# Patient Record
Sex: Female | Born: 1985 | Race: White | Hispanic: No | Smoking: Never smoker
Health system: Southern US, Community
[De-identification: ages and names within clinical notes are randomized; demographics above are authoritative.]

## PROBLEM LIST (undated history)

## (undated) ENCOUNTER — Inpatient Hospital Stay (HOSPITAL_COMMUNITY): Payer: Self-pay

## (undated) DIAGNOSIS — F32A Depression, unspecified: Secondary | ICD-10-CM

## (undated) DIAGNOSIS — E039 Hypothyroidism, unspecified: Secondary | ICD-10-CM

## (undated) DIAGNOSIS — I10 Essential (primary) hypertension: Secondary | ICD-10-CM

## (undated) DIAGNOSIS — E119 Type 2 diabetes mellitus without complications: Secondary | ICD-10-CM

## (undated) DIAGNOSIS — F329 Major depressive disorder, single episode, unspecified: Secondary | ICD-10-CM

## (undated) HISTORY — PX: WISDOM TOOTH EXTRACTION: SHX21

---

## 2008-01-30 ENCOUNTER — Inpatient Hospital Stay (HOSPITAL_COMMUNITY): Admission: AD | Admit: 2008-01-30 | Discharge: 2008-01-30 | Payer: Self-pay | Admitting: Obstetrics and Gynecology

## 2008-03-06 ENCOUNTER — Inpatient Hospital Stay (HOSPITAL_COMMUNITY): Admission: AD | Admit: 2008-03-06 | Discharge: 2008-03-07 | Payer: Self-pay | Admitting: Obstetrics and Gynecology

## 2008-03-30 ENCOUNTER — Inpatient Hospital Stay (HOSPITAL_COMMUNITY): Admission: AD | Admit: 2008-03-30 | Discharge: 2008-03-30 | Payer: Self-pay | Admitting: Obstetrics and Gynecology

## 2008-03-31 ENCOUNTER — Inpatient Hospital Stay (HOSPITAL_COMMUNITY): Admission: AD | Admit: 2008-03-31 | Discharge: 2008-03-31 | Payer: Self-pay | Admitting: Obstetrics and Gynecology

## 2008-04-07 ENCOUNTER — Inpatient Hospital Stay (HOSPITAL_COMMUNITY): Admission: AD | Admit: 2008-04-07 | Discharge: 2008-04-07 | Payer: Self-pay | Admitting: Obstetrics and Gynecology

## 2008-04-10 ENCOUNTER — Inpatient Hospital Stay (HOSPITAL_COMMUNITY): Admission: AD | Admit: 2008-04-10 | Discharge: 2008-04-15 | Payer: Self-pay | Admitting: Obstetrics and Gynecology

## 2008-04-13 ENCOUNTER — Encounter (INDEPENDENT_AMBULATORY_CARE_PROVIDER_SITE_OTHER): Payer: Self-pay | Admitting: Obstetrics and Gynecology

## 2008-04-27 DIAGNOSIS — F329 Major depressive disorder, single episode, unspecified: Secondary | ICD-10-CM

## 2008-04-27 DIAGNOSIS — E669 Obesity, unspecified: Secondary | ICD-10-CM | POA: Insufficient documentation

## 2008-04-27 DIAGNOSIS — F411 Generalized anxiety disorder: Secondary | ICD-10-CM

## 2008-04-27 DIAGNOSIS — J309 Allergic rhinitis, unspecified: Secondary | ICD-10-CM | POA: Insufficient documentation

## 2008-10-08 IMAGING — US US FETAL BPP W/O NONSTRESS
1 series · 14 of 16 positions shown · non-contrast
Comparison: none

OBSTETRICAL ULTRASOUND:
 This ultrasound exam was performed in the [HOSPITAL] Ultrasound Department.  The OB US report was generated in the AS system, and faxed to the ordering physician.  This report is also available in [REDACTED] PACS.

[Series 1: us fetal bpp w/o nonstress · non-contrast · 14 of 16 slices shown]
[im 1/16]
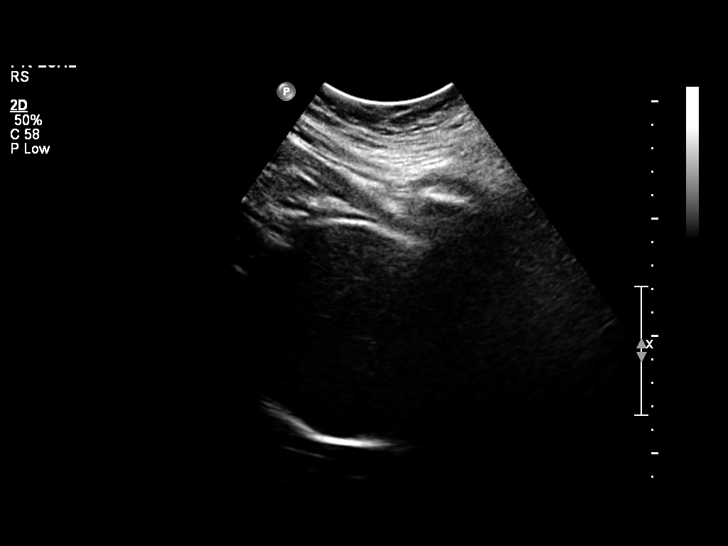
[im 2/16]
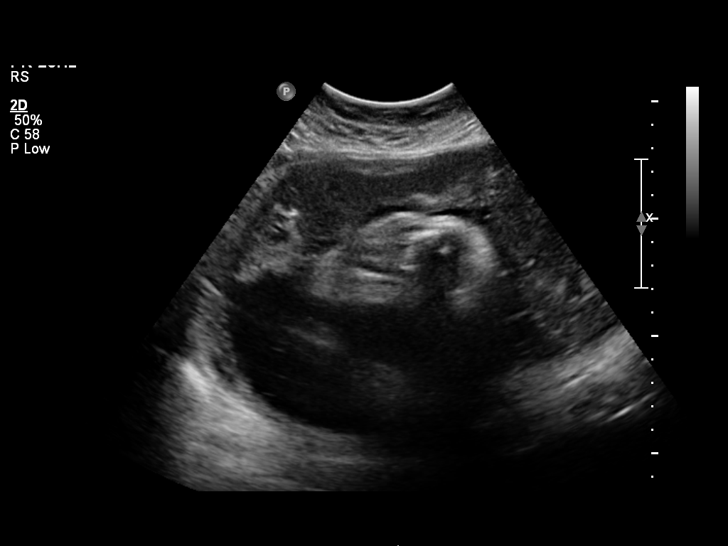
[im 3/16]
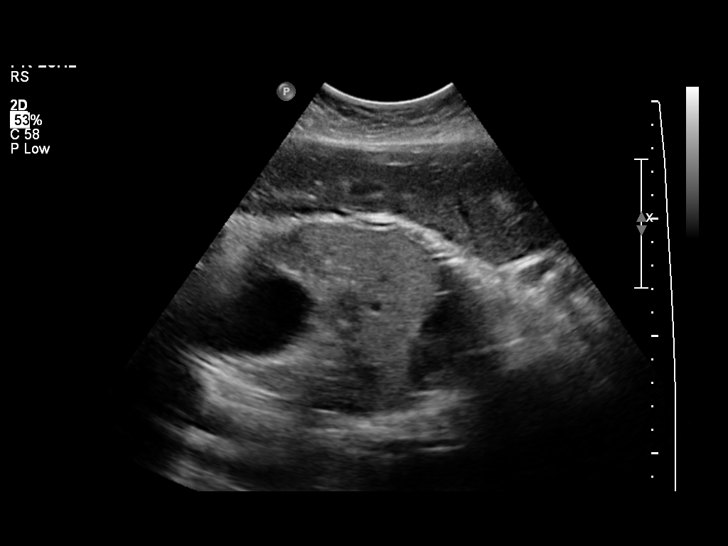
[im 5/16]
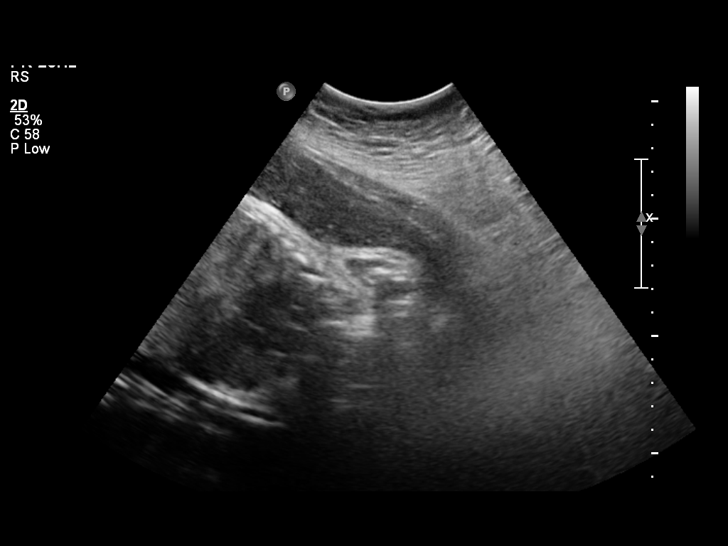
[im 6/16]
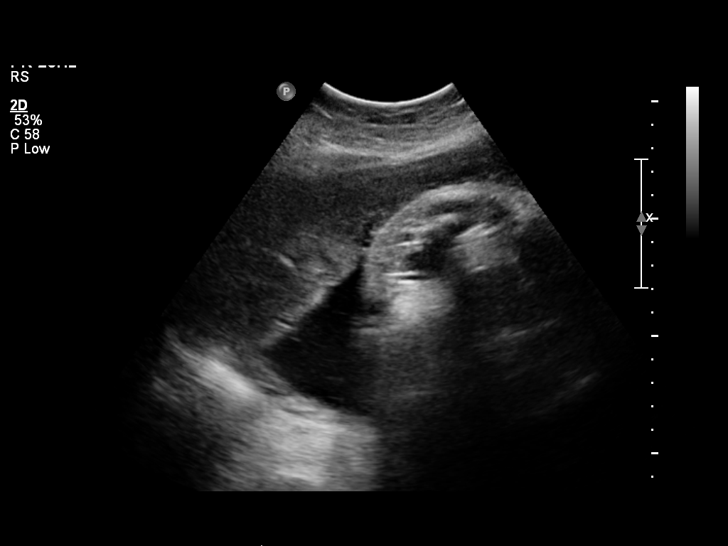
[im 7/16]
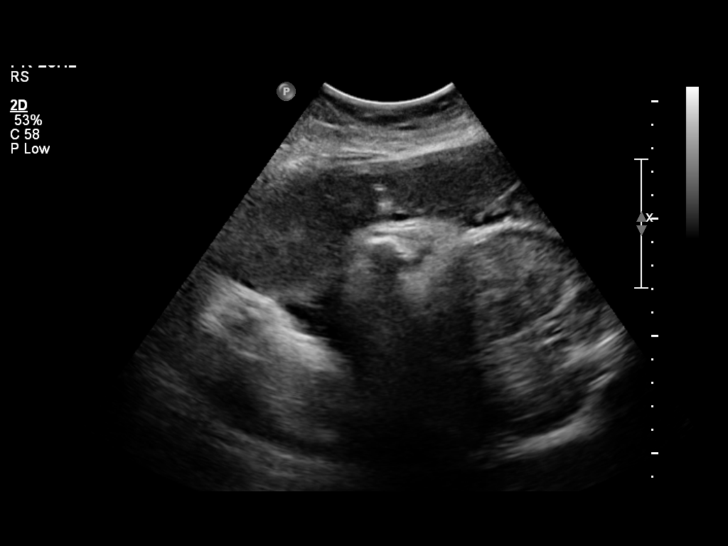
[im 8/16]
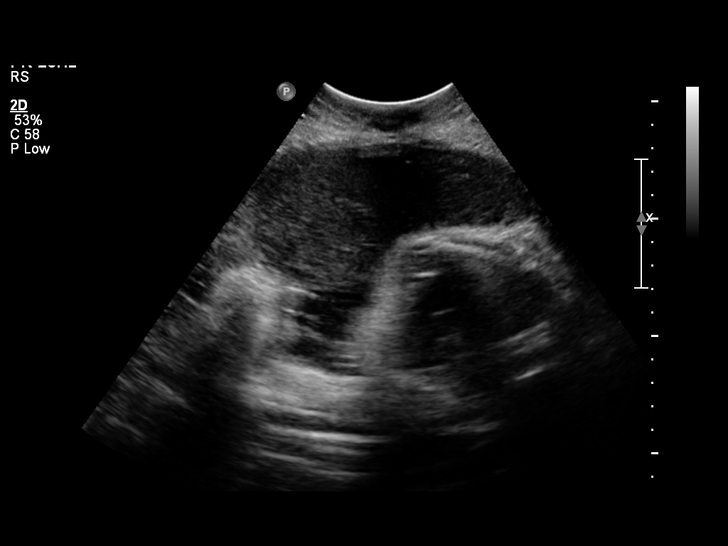
[im 9/16]
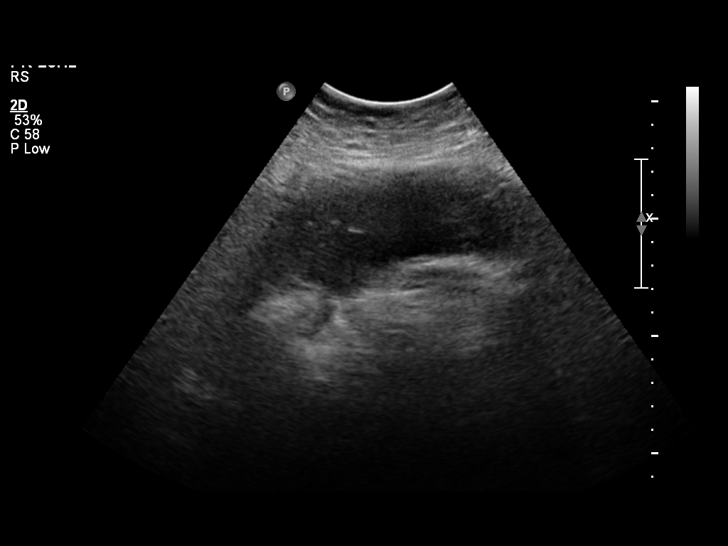
[im 10/16]
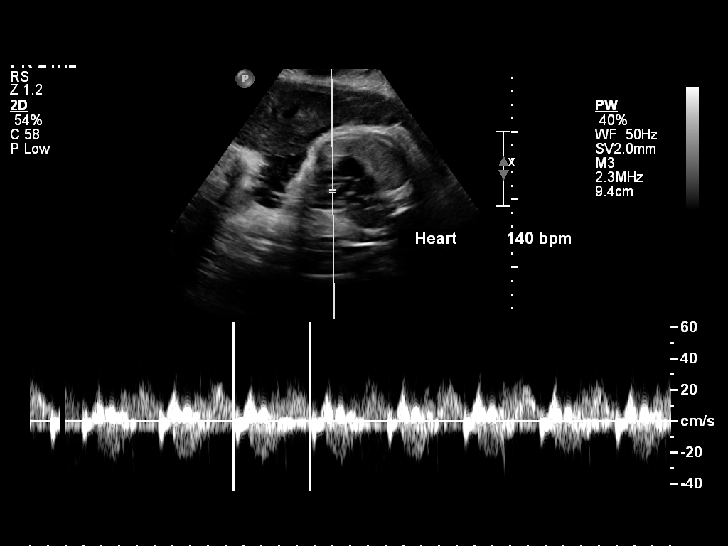
[im 11/16]
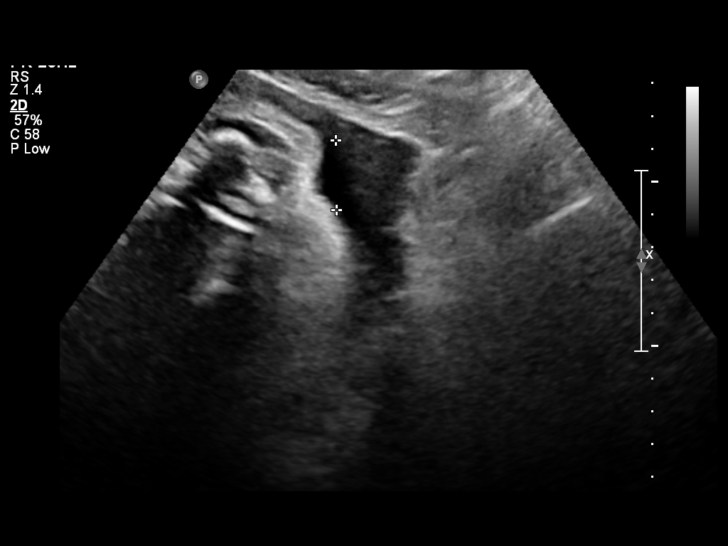
[im 13/16]
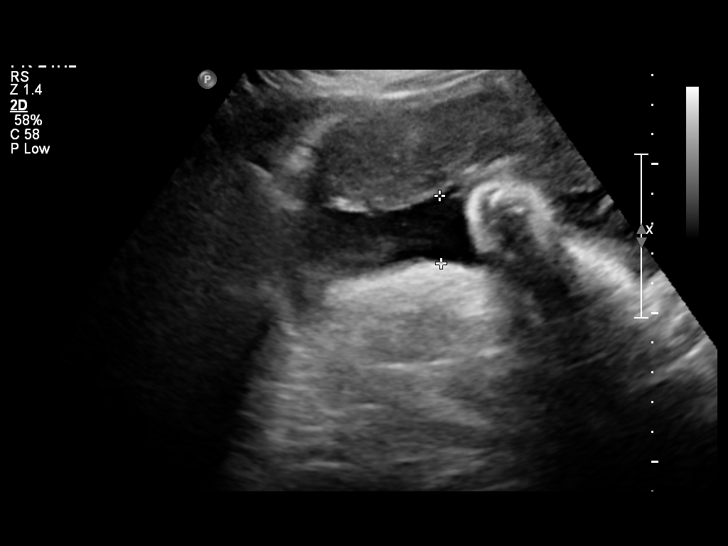
[im 14/16]
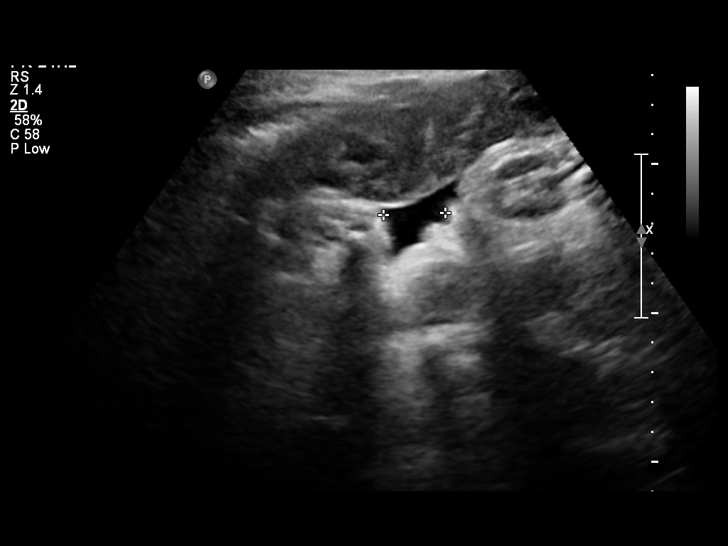
[im 15/16]
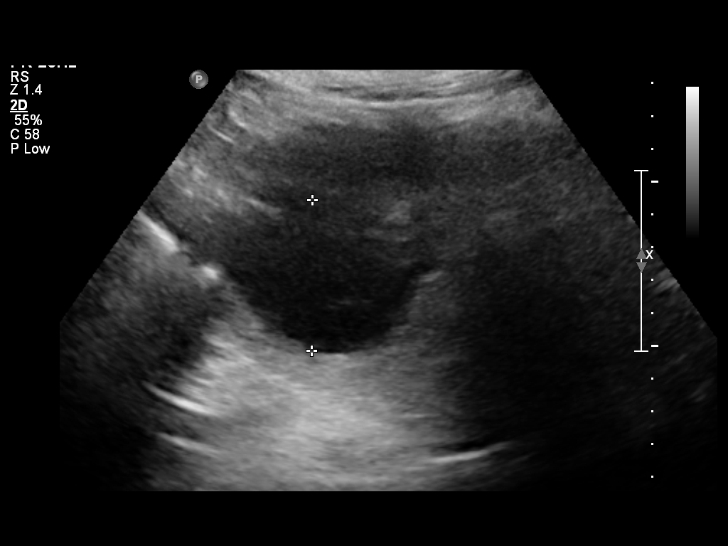
[im 16/16]
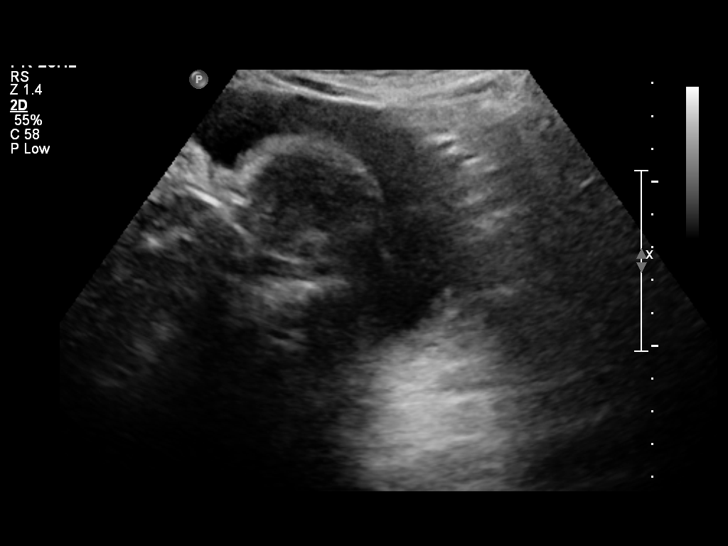

[14 of 16 positions shown; findings below may reference images not displayed]

IMPRESSION: See AS Obstetric US report.

## 2009-05-01 ENCOUNTER — Ambulatory Visit (HOSPITAL_COMMUNITY): Admission: RE | Admit: 2009-05-01 | Discharge: 2009-05-01 | Payer: Self-pay | Admitting: Obstetrics and Gynecology

## 2009-05-16 ENCOUNTER — Inpatient Hospital Stay (HOSPITAL_COMMUNITY): Admission: AD | Admit: 2009-05-16 | Discharge: 2009-05-16 | Payer: Self-pay | Admitting: Obstetrics and Gynecology

## 2009-06-12 ENCOUNTER — Inpatient Hospital Stay (HOSPITAL_COMMUNITY): Admission: AD | Admit: 2009-06-12 | Discharge: 2009-06-12 | Payer: Self-pay | Admitting: Obstetrics and Gynecology

## 2009-06-17 ENCOUNTER — Inpatient Hospital Stay (HOSPITAL_COMMUNITY): Admission: AD | Admit: 2009-06-17 | Discharge: 2009-06-20 | Payer: Self-pay | Admitting: Obstetrics and Gynecology

## 2009-08-26 ENCOUNTER — Ambulatory Visit (HOSPITAL_COMMUNITY): Admission: RE | Admit: 2009-08-26 | Discharge: 2009-08-26 | Payer: Self-pay | Admitting: Obstetrics and Gynecology

## 2010-10-19 LAB — CBC
MCV: 82.2 fL (ref 78.0–100.0)
Platelets: 272 10*3/uL (ref 150–400)
RBC: 4.76 MIL/uL (ref 3.87–5.11)
WBC: 9.8 10*3/uL (ref 4.0–10.5)

## 2010-10-19 LAB — PREGNANCY, URINE: Preg Test, Ur: NEGATIVE

## 2010-10-19 LAB — URINALYSIS, ROUTINE W REFLEX MICROSCOPIC
Nitrite: NEGATIVE
Specific Gravity, Urine: 1.015 (ref 1.005–1.030)
Urobilinogen, UA: 0.2 mg/dL (ref 0.0–1.0)
pH: 5 (ref 5.0–8.0)

## 2010-11-05 LAB — COMPREHENSIVE METABOLIC PANEL
ALT: 31 U/L (ref 0–35)
AST: 25 U/L (ref 0–37)
AST: 40 U/L — ABNORMAL HIGH (ref 0–37)
Albumin: 2.5 g/dL — ABNORMAL LOW (ref 3.5–5.2)
Alkaline Phosphatase: 129 U/L — ABNORMAL HIGH (ref 39–117)
CO2: 22 mEq/L (ref 19–32)
CO2: 23 mEq/L (ref 19–32)
Calcium: 8.8 mg/dL (ref 8.4–10.5)
Chloride: 105 mEq/L (ref 96–112)
Creatinine, Ser: 0.52 mg/dL (ref 0.4–1.2)
GFR calc Af Amer: >60 mL/min (ref >60)
GFR calc Af Amer: >60 mL/min (ref >60)
GFR calc non Af Amer: >60 mL/min (ref >60)
GFR calc non Af Amer: >60 mL/min (ref >60)
Sodium: 136 mEq/L (ref 135–145)
Total Bilirubin: 0.2 mg/dL — ABNORMAL LOW (ref 0.3–1.2)

## 2010-11-05 LAB — URINE MICROSCOPIC-ADD ON

## 2010-11-05 LAB — CBC
HCT: 27.7 % — ABNORMAL LOW (ref 36.0–46.0)
Hemoglobin: 9.3 g/dL — ABNORMAL LOW (ref 12.0–15.0)
Hemoglobin: 9.9 g/dL — ABNORMAL LOW (ref 12.0–15.0)
Hemoglobin: 10.6 g/dL — ABNORMAL LOW (ref 12.0–15.0)
MCHC: 33.2 g/dL (ref 30.0–36.0)
MCHC: 33.4 g/dL (ref 30.0–36.0)
MCHC: 33.7 g/dL (ref 30.0–36.0)
MCV: 81.2 fL (ref 78.0–100.0)
MCV: 80.2 fL (ref 78.0–100.0)
Platelets: 179 10*3/uL (ref 150–400)
RBC: 3.41 MIL/uL — ABNORMAL LOW (ref 3.87–5.11)
RBC: 3.71 MIL/uL — ABNORMAL LOW (ref 3.87–5.11)
RBC: 3.88 MIL/uL (ref 3.87–5.11)
RDW: 15.1 % (ref 11.5–15.5)
WBC: 14.9 10*3/uL — ABNORMAL HIGH (ref 4.0–10.5)
WBC: 15.3 10*3/uL — ABNORMAL HIGH (ref 4.0–10.5)
WBC: 11.8 10*3/uL — ABNORMAL HIGH (ref 4.0–10.5)

## 2010-11-05 LAB — URINALYSIS, ROUTINE W REFLEX MICROSCOPIC
Bilirubin Urine: NEGATIVE
Glucose, UA: NEGATIVE mg/dL
Hgb urine dipstick: NEGATIVE
Protein, ur: NEGATIVE mg/dL

## 2010-11-05 LAB — RPR: RPR Ser Ql: NONREACTIVE

## 2010-11-05 LAB — RH IMMUNE GLOB WKUP(>/=20WKS)(NOT WOMEN'S HOSP)

## 2010-11-06 LAB — URIC ACID: Uric Acid, Serum: 5.2 mg/dL (ref 2.4–7.0)

## 2010-11-06 LAB — COMPREHENSIVE METABOLIC PANEL
AST: 24 U/L (ref 0–37)
Albumin: 3 g/dL — ABNORMAL LOW (ref 3.5–5.2)
Alkaline Phosphatase: 93 U/L (ref 39–117)
BUN: 3 mg/dL — ABNORMAL LOW (ref 6–23)
Chloride: 104 mEq/L (ref 96–112)
Potassium: 3.4 mEq/L — ABNORMAL LOW (ref 3.5–5.1)
Total Bilirubin: 0.6 mg/dL (ref 0.3–1.2)

## 2010-11-06 LAB — CBC
HCT: 32.5 % — ABNORMAL LOW (ref 36.0–46.0)
Platelets: 198 10*3/uL (ref 150–400)
WBC: 11.3 10*3/uL — ABNORMAL HIGH (ref 4.0–10.5)

## 2010-11-07 LAB — RH IMMUNE GLOBULIN WORKUP (NOT WOMEN'S HOSP)
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
## 2011-04-30 LAB — RH IMMUNE GLOBULIN WORKUP (NOT WOMEN'S HOSP)
ABO/RH(D): A NEG
Antibody Screen: NEGATIVE

## 2011-05-01 LAB — URINALYSIS, ROUTINE W REFLEX MICROSCOPIC
Bilirubin Urine: NEGATIVE
Hgb urine dipstick: NEGATIVE
Ketones, ur: NEGATIVE
Protein, ur: NEGATIVE
Urobilinogen, UA: 0.2

## 2011-05-06 LAB — COMPREHENSIVE METABOLIC PANEL
AST: 34
Albumin: 2.3 — ABNORMAL LOW
BUN: 4 — ABNORMAL LOW
BUN: 8
BUN: 9
CO2: 23
CO2: 25
Calcium: 7.2 — ABNORMAL LOW
Calcium: 8.5
Calcium: 8.6
Chloride: 104
Creatinine, Ser: 0.48
Creatinine, Ser: 0.54
Creatinine, Ser: 0.64
GFR calc Af Amer: >60
GFR calc Af Amer: >60
GFR calc non Af Amer: >60
GFR calc non Af Amer: >60
GFR calc non Af Amer: >60
Glucose, Bld: 75
Sodium: 135
Total Bilirubin: 0.2 — ABNORMAL LOW
Total Protein: 5.9 — ABNORMAL LOW

## 2011-05-06 LAB — PROTEIN, URINE, 24 HOUR
Protein, 24H Urine: 1071 — ABNORMAL HIGH
Protein, Urine: 102

## 2011-05-06 LAB — URINALYSIS, ROUTINE W REFLEX MICROSCOPIC
Ketones, ur: NEGATIVE
Leukocytes, UA: NEGATIVE
Nitrite: NEGATIVE
Specific Gravity, Urine: <1.005 — ABNORMAL LOW
Urobilinogen, UA: 0.2
pH: 6

## 2011-05-06 LAB — CBC
HCT: 27.2 — ABNORMAL LOW
HCT: 33.3 — ABNORMAL LOW
Hemoglobin: 10.3 — ABNORMAL LOW
Hemoglobin: 10.9 — ABNORMAL LOW
Hemoglobin: 11.1 — ABNORMAL LOW
MCHC: 33.4
MCHC: 33.4
MCHC: 33.5
MCHC: 33.8
MCV: 85.8
MCV: 85.9
MCV: 86.3
MCV: 86.4
Platelets: 147 — ABNORMAL LOW
Platelets: 165
RBC: 3.16 — ABNORMAL LOW
RBC: 3.78 — ABNORMAL LOW
RDW: 14.1
RDW: 14.3
WBC: 16.9 — ABNORMAL HIGH
WBC: 19.1 — ABNORMAL HIGH
WBC: 23.2 — ABNORMAL HIGH

## 2011-05-06 LAB — CREATININE CLEARANCE, URINE, 24 HOUR
Collection Interval-CRCL: 24
Creatinine, Urine: 137.7
Urine Total Volume-CRCL: 1050

## 2011-05-06 LAB — URIC ACID
Uric Acid, Serum: 4.5
Uric Acid, Serum: 5.1
Uric Acid, Serum: 5.6

## 2011-05-06 LAB — RPR: RPR Ser Ql: NONREACTIVE

## 2011-05-06 LAB — MAGNESIUM: Magnesium: 5.5 — ABNORMAL HIGH

## 2011-05-06 LAB — URINE MICROSCOPIC-ADD ON

## 2011-05-06 LAB — RH IMMUNE GLOB WKUP(>/=20WKS)(NOT WOMEN'S HOSP)

## 2011-05-06 LAB — LACTATE DEHYDROGENASE: LDH: 224

## 2013-08-01 DIAGNOSIS — F4310 Post-traumatic stress disorder, unspecified: Secondary | ICD-10-CM | POA: Insufficient documentation

## 2014-06-01 LAB — OB RESULTS CONSOLE HEPATITIS B SURFACE ANTIGEN: HEP B S AG: NEGATIVE

## 2014-06-01 LAB — OB RESULTS CONSOLE GC/CHLAMYDIA
CHLAMYDIA, DNA PROBE: NEGATIVE
Gonorrhea: NEGATIVE

## 2014-06-01 LAB — OB RESULTS CONSOLE RUBELLA ANTIBODY, IGM: Rubella: IMMUNE

## 2014-06-01 LAB — OB RESULTS CONSOLE HIV ANTIBODY (ROUTINE TESTING): HIV: NONREACTIVE

## 2014-06-01 LAB — OB RESULTS CONSOLE RPR: RPR: NONREACTIVE

## 2014-08-03 ENCOUNTER — Encounter (HOSPITAL_COMMUNITY): Payer: Self-pay

## 2014-08-03 ENCOUNTER — Inpatient Hospital Stay (HOSPITAL_COMMUNITY)
Admission: AD | Admit: 2014-08-03 | Discharge: 2014-08-03 | Disposition: A | Discharge status: Home / Self Care | Payer: Medicaid Other | Source: Ambulatory Visit | Attending: Obstetrics & Gynecology | Admitting: Obstetrics & Gynecology

## 2014-08-03 DIAGNOSIS — R109 Unspecified abdominal pain: Secondary | ICD-10-CM | POA: Diagnosis present

## 2014-08-03 DIAGNOSIS — O9989 Other specified diseases and conditions complicating pregnancy, childbirth and the puerperium: Secondary | ICD-10-CM | POA: Insufficient documentation

## 2014-08-03 DIAGNOSIS — Z3A28 28 weeks gestation of pregnancy: Secondary | ICD-10-CM | POA: Insufficient documentation

## 2014-08-03 HISTORY — DX: Type 2 diabetes mellitus without complications: E11.9

## 2014-08-03 HISTORY — DX: Depression, unspecified: F32.A

## 2014-08-03 HISTORY — DX: Essential (primary) hypertension: I10

## 2014-08-03 HISTORY — DX: Major depressive disorder, single episode, unspecified: F32.9

## 2014-08-03 HISTORY — DX: Hypothyroidism, unspecified: E03.9

## 2014-08-03 LAB — WET PREP, GENITAL
Clue Cells Wet Prep HPF POC: NONE SEEN
TRICH WET PREP: NONE SEEN
Yeast Wet Prep HPF POC: NONE SEEN

## 2014-08-03 LAB — URINALYSIS, ROUTINE W REFLEX MICROSCOPIC
Glucose, UA: NEGATIVE mg/dL
Hgb urine dipstick: NEGATIVE
KETONES UR: NEGATIVE mg/dL
Leukocytes, UA: NEGATIVE
NITRITE: NEGATIVE
Protein, ur: NEGATIVE mg/dL
UROBILINOGEN UA: 0.2 mg/dL (ref 0.0–1.0)
pH: 5.5 (ref 5.0–8.0)

## 2014-08-03 LAB — GLUCOSE, CAPILLARY: GLUCOSE-CAPILLARY: 89 mg/dL (ref 70–99)

## 2014-08-03 LAB — FETAL FIBRONECTIN: FETAL FIBRONECTIN: NEGATIVE

## 2014-08-03 MED ORDER — PANTOPRAZOLE SODIUM 40 MG PO TBEC: 40.0000 mg | DELAYED_RELEASE_TABLET | Freq: Every day | ORAL | Status: DC

## 2014-08-03 NOTE — Discharge Instructions (Signed)
Third Trimester of Pregnancy The third trimester is from week 29 through week 42, months 7 through 9. The third trimester is a time when the fetus is growing rapidly. At the end of the ninth month, the fetus is about 20 inches in length and weighs 6-10 pounds.  BODY CHANGES Your body goes through many changes during pregnancy. The changes vary from woman to woman.   Your weight will continue to increase. You can expect to gain 25-35 pounds (11-16 kg) by the end of the pregnancy.  You may begin to get stretch marks on your hips, abdomen, and breasts.  You may urinate more often because the fetus is moving lower into your pelvis and pressing on your bladder.  You may develop or continue to have heartburn as a result of your pregnancy.  You may develop constipation because certain hormones are causing the muscles that push waste through your intestines to slow down.  You may develop hemorrhoids or swollen, bulging veins (varicose veins).  You may have pelvic pain because of the weight gain and pregnancy hormones relaxing your joints between the bones in your pelvis. Backaches may result from overexertion of the muscles supporting your posture.  You may have changes in your hair. These can include thickening of your hair, rapid growth, and changes in texture. Some women also have hair loss during or after pregnancy, or hair that feels dry or thin. Your hair will most likely return to normal after your baby is born.  Your breasts will continue to grow and be tender. A yellow discharge may leak from your breasts called colostrum.  Your belly button may stick out.  You may feel short of breath because of your expanding uterus.  You may notice the fetus "dropping," or moving lower in your abdomen.  You may have a bloody mucus discharge. This usually occurs a few days to a week before labor begins.  Your cervix becomes thin and soft (effaced) near your due date. WHAT TO EXPECT AT YOUR PRENATAL  EXAMS  You will have prenatal exams every 2 weeks until week 36. Then, you will have weekly prenatal exams. During a routine prenatal visit:  You will be weighed to make sure you and the fetus are growing normally.  Your blood pressure is taken.  Your abdomen will be measured to track your baby's growth.  The fetal heartbeat will be listened to.  Any test results from the previous visit will be discussed.  You may have a cervical check near your due date to see if you have effaced. At around 36 weeks, your caregiver will check your cervix. At the same time, your caregiver will also perform a test on the secretions of the vaginal tissue. This test is to determine if a type of bacteria, Group B streptococcus, is present. Your caregiver will explain this further. Your caregiver may ask you:  What your birth plan is.  How you are feeling.  If you are feeling the baby move.  If you have had any abnormal symptoms, such as leaking fluid, bleeding, severe headaches, or abdominal cramping.  If you have any questions. Other tests or screenings that may be performed during your third trimester include:  Blood tests that check for low iron levels (anemia).  Fetal testing to check the health, activity level, and growth of the fetus. Testing is done if you have certain medical conditions or if there are problems during the pregnancy. FALSE LABOR You may feel small, irregular contractions that   eventually go away. These are called Braxton Hicks contractions, or false labor. Contractions may last for hours, days, or even weeks before true labor sets in. If contractions come at regular intervals, intensify, or become painful, it is best to be seen by your caregiver.  SIGNS OF LABOR   Menstrual-like cramps.  Contractions that are 5 minutes apart or less.  Contractions that start on the top of the uterus and spread down to the lower abdomen and back.  A sense of increased pelvic pressure or back  pain.  A watery or bloody mucus discharge that comes from the vagina. If you have any of these signs before the 37th week of pregnancy, call your caregiver right away. You need to go to the hospital to get checked immediately. HOME CARE INSTRUCTIONS   Avoid all smoking, herbs, alcohol, and unprescribed drugs. These chemicals affect the formation and growth of the baby.  Follow your caregiver's instructions regarding medicine use. There are medicines that are either safe or unsafe to take during pregnancy.  Exercise only as directed by your caregiver. Experiencing uterine cramps is a good sign to stop exercising.  Continue to eat regular, healthy meals.  Wear a good support bra for breast tenderness.  Do not use hot tubs, steam rooms, or saunas.  Wear your seat belt at all times when driving.  Avoid raw meat, uncooked cheese, cat litter boxes, and soil used by cats. These carry germs that can cause birth defects in the baby.  Take your prenatal vitamins.  Try taking a stool softener (if your caregiver approves) if you develop constipation. Eat more high-fiber foods, such as fresh vegetables or fruit and whole grains. Drink plenty of fluids to keep your urine clear or pale yellow.  Take warm sitz baths to soothe any pain or discomfort caused by hemorrhoids. Use hemorrhoid cream if your caregiver approves.  If you develop varicose veins, wear support hose. Elevate your feet for 15 minutes, 3-4 times a day. Limit salt in your diet.  Avoid heavy lifting, wear low heal shoes, and practice good posture.  Rest a lot with your legs elevated if you have leg cramps or low back pain.  Visit your dentist if you have not gone during your pregnancy. Use a soft toothbrush to brush your teeth and be gentle when you floss.  A sexual relationship may be continued unless your caregiver directs you otherwise.  Do not travel far distances unless it is absolutely necessary and only with the approval  of your caregiver.  Take prenatal classes to understand, practice, and ask questions about the labor and delivery.  Make a trial run to the hospital.  Pack your hospital bag.  Prepare the baby's nursery.  Continue to go to all your prenatal visits as directed by your caregiver. SEEK MEDICAL CARE IF:  You are unsure if you are in labor or if your water has broken.  You have dizziness.  You have mild pelvic cramps, pelvic pressure, or nagging pain in your abdominal area.  You have persistent nausea, vomiting, or diarrhea.  You have a bad smelling vaginal discharge.  You have pain with urination. SEEK IMMEDIATE MEDICAL CARE IF:   You have a fever.  You are leaking fluid from your vagina.  You have spotting or bleeding from your vagina.  You have severe abdominal cramping or pain.  You have rapid weight loss or gain.  You have shortness of breath with chest pain.  You notice sudden or extreme swelling   of your face, hands, ankles, feet, or legs.  You have not felt your baby move in over an hour.  You have severe headaches that do not go away with medicine.  You have vision changes. Document Released: 07/14/2001 Document Revised: 07/25/2013 Document Reviewed: 09/20/2012 Exit[employer] Patient Information 2015 [employer]. This information is not intended to replace advice given to you by your health care provider. Make sure you discuss any questions you have with your health care provider.  

## 2014-08-03 NOTE — MAU Provider Note (Signed)
History  29 yo G3P2 @ 28.1 wks presents to MAU w/ c/o sudden onset of dull/sharp abdominal pain that started at work this evening, radiating to her lower back. Reports feeling clammy and hot. Took regular strength Tylenol at 6:30 PM. Pain not associated with N/V. +Heartburn. Denies fevers or chills. Reports active fetus. Denies VB or LOF. Denies recent intercourse.  Patient Active Problem List   Diagnosis Date Noted  . OBESITY 04/27/2008  . ANXIETY 04/27/2008  . DEPRESSION 04/27/2008  . ALLERGIC RHINITIS 04/27/2008    Chief Complaint  Patient presents with  . Abdominal Cramping   HPI See above OB History    Gravida Para Term Preterm AB TAB SAB Ectopic Multiple Living   Past Medical History  Diagnosis Date  . Hypertension   . Hypothyroidism   . Diabetes mellitus without complication   . Depression     Past Surgical History  Procedure Laterality Date  . Wisdom tooth extraction      No family history on file.  History  Substance Use Topics  . Smoking status: Never Smoker   . Smokeless tobacco: Not on file  . Alcohol Use: No    Allergies:  Allergies  Allergen Reactions  . Sertraline Hcl Other (See Comments)    Pt reports tightness in chest, with no swelling.    Prescriptions prior to admission  Medication Sig Dispense Refill Last Dose  . acetaminophen (TYLENOL) 325 MG tablet Take 650 mg by mouth every 6 (six) hours as needed for moderate pain.   08/03/2014 at Unknown time  . buPROPion (WELLBUTRIN XL) 300 MG 24 hr tablet Take 300 mg by mouth daily.   08/03/2014 at Unknown time    ROS  Abdominal pain Physical Exam   Results for orders placed or performed during the hospital encounter of 08/03/14 (from the past 24 hour(s))  Urinalysis, Routine w reflex microscopic     Status: Abnormal   Collection Time: 08/03/14  7:21 PM  Result Value Ref Range   Color, Urine YELLOW YELLOW   APPearance CLEAR CLEAR   Specific Gravity, Urine >1.030 (H) 1.005 -  1.030   pH 5.5 5.0 - 8.0   Glucose, UA NEGATIVE NEGATIVE mg/dL   Hgb urine dipstick NEGATIVE NEGATIVE   Bilirubin Urine SMALL (A) NEGATIVE   Ketones, ur NEGATIVE NEGATIVE mg/dL   Protein, ur NEGATIVE NEGATIVE mg/dL   Urobilinogen, UA 0.2 0.0 - 1.0 mg/dL   Nitrite NEGATIVE NEGATIVE   Leukocytes, UA NEGATIVE NEGATIVE  Wet prep, genital     Status: Abnormal   Collection Time: 08/03/14  8:30 PM  Result Value Ref Range   Yeast Wet Prep HPF POC NONE SEEN NONE SEEN   Trich, Wet Prep NONE SEEN NONE SEEN   Clue Cells Wet Prep HPF POC NONE SEEN NONE SEEN   WBC, Wet Prep HPF POC MODERATE (A) NONE SEEN  Fetal fibronectin     Status: None   Collection Time: 08/03/14  8:30 PM  Result Value Ref Range   Fetal Fibronectin NEGATIVE NEGATIVE  Glucose, capillary     Status: None   Collection Time: 08/03/14  8:43 PM  Result Value Ref Range   Glucose-Capillary 89 70 - 99 mg/dL     Blood pressure 161/09, pulse 107, temperature 98.2 F (36.8 C), temperature source Oral, resp. rate 20, height  (1.575 m), weight 189 lb (85.73 kg), SpO2 100 %.  Physical  Exam Gen: Mild distress Abdomen: gravid, soft, tender to palpation in lower abdomen Neg CVAT bilaterally Pelvic: Cvx visually closed; no active bleeding SSE: Small amount of white d/c noted; sample collected for GC/CT and wet prep FHRT: BL 150 w/ moderate variability, +accels, no decels UCs: None ED Course  UA Urine culture Wet prep GC/CT fFN Assessment: Reassuring FHRT for this GA Likely round ligament pain  Plan: Await lab results   Sherre Scarlet CNM, MS 08/03/2014 8:17 PM    ADDENDUM: A: GC/CT pending Neg wet prep and fFN Suspect round ligament pain  P: Reviewed common discomforts of pregnancy along w/ their relief measures Protonix Encouraged hydration and rest Work note OB f/u on 08/07/14  Sherre Scarlet, CNM 08/03/14, 9:15 PM

## 2014-08-03 NOTE — MAU Note (Signed)
Sudden onset of abdominal pain 1 hour ago; dull & sharp pain in lower abdomen. Denies vaginal bleeding or LOF. Decreased fetal movement since pain started but has felt baby move. Felt sweaty and weak when pain started.

## 2014-08-03 NOTE — MAU Note (Signed)
Pt reports feeling pain at work in her lower abdomen. Pt felt hot and clammy and was sweating. Pt reports the pain to be is a 7/10 and is stabbing.

## 2014-08-03 NOTE — L&D Delivery Note (Signed)
2330: In room to start pushing.  Patient continues to deny urge to push, but agitation seems to have decreased.  Room prepped for delivery.  Provider reiterated possibility of shoulder dystocia based on most recent US results with EFW of 9lbs 12oz.  Patient verbalized understanding and instructed on pushing techniques.  Patient delivered as below with extensive staff and family (mother) support and encouragement.    Delivery Note At 11:47 PM, on 10/22/2014, a viable female "Cynthia Foster" was delivered via SVD (Presentation:Right Occiput Anterior with manual restitution to ROT). Upon delivery of head, maternal pushing efforts decreased despite provider direction. Nuchal cord noted and manually reduced, but after 15 seconds the OB team was called for shoulder dystocia assistance. Upon arrival, nurses instructed to perform McRoberts maneuver which was unsuccessful.  McRoberts position held and suprapubic pressure, from the right side, was performed with successful release of shoulders. Infant with flaccid tone, blue in color, and attempting respiratory efforts.  Provider gave tactile stimulation, but respiratory efforts remained apneic and infant placed on mother's abdomen were cord was clamped and cut. Nurse continued tactile stimulation while taking over to warmer and Code Apgar called. Dr.J. Wimmer and team in to evaluate and infant APGARs: 4, 9. Reassurances given to patient, who was understandably hysterical. Cord blood collected and placenta delivered spontaneously and noted to be intact with 3VC upon inspection. Vaginal inspection revealed no lacerations.  Fundus firm, at the umbilicus, and bleeding small.  Mother hemodynamically stable and infant being placed skin to skin prior to provider exit.  Mother desires nexplanon for birth control method and opts to breastfeed.  Family wishes for infant to be circumcised in outpatient setting. Infant weight at one hour of life: 9lbs 2oz,   Anesthesia:  Epidural Episiotomy:  None Lacerations:  None Suture Repair: N/A Est. Blood Loss (mL):  200 Shoulder Dystocia Maneuvers: 1) Call for assistance 2) McRoberts 3) Suprapubic Pressure  Mom to postpartum.  Baby to Couplet care / Skin to Skin.  Cynthia Foster, Cynthia Villalon LYNN MSN, CNM 10/23/2014, 12:35 AM

## 2014-08-08 LAB — GC/CHLAMYDIA PROBE AMP
CT Probe RNA: NEGATIVE
GC PROBE AMP APTIMA: NEGATIVE

## 2014-09-24 LAB — OB RESULTS CONSOLE GBS: GBS: NEGATIVE

## 2014-09-25 ENCOUNTER — Encounter (HOSPITAL_COMMUNITY): Payer: Self-pay | Admitting: *Deleted

## 2014-09-25 ENCOUNTER — Inpatient Hospital Stay (HOSPITAL_COMMUNITY)
Admission: AD | Admit: 2014-09-25 | Discharge: 2014-09-26 | Disposition: A | Discharge status: Home / Self Care | Payer: Medicaid Other | Source: Ambulatory Visit | Attending: Obstetrics & Gynecology | Admitting: Obstetrics & Gynecology

## 2014-09-25 DIAGNOSIS — O9989 Other specified diseases and conditions complicating pregnancy, childbirth and the puerperium: Secondary | ICD-10-CM | POA: Insufficient documentation

## 2014-09-25 DIAGNOSIS — R1030 Lower abdominal pain, unspecified: Secondary | ICD-10-CM | POA: Insufficient documentation

## 2014-09-25 DIAGNOSIS — Z3A36 36 weeks gestation of pregnancy: Secondary | ICD-10-CM | POA: Insufficient documentation

## 2014-09-25 DIAGNOSIS — R109 Unspecified abdominal pain: Secondary | ICD-10-CM

## 2014-09-25 LAB — URINALYSIS, ROUTINE W REFLEX MICROSCOPIC
Bilirubin Urine: NEGATIVE
GLUCOSE, UA: 100 mg/dL — AB
Hgb urine dipstick: NEGATIVE
Ketones, ur: NEGATIVE mg/dL
Nitrite: NEGATIVE
PROTEIN: NEGATIVE mg/dL
SPECIFIC GRAVITY, URINE: 1.02 (ref 1.005–1.030)
Urobilinogen, UA: 0.2 mg/dL (ref 0.0–1.0)
pH: 7 (ref 5.0–8.0)

## 2014-09-25 LAB — URINE MICROSCOPIC-ADD ON

## 2014-09-25 MED ORDER — CYCLOBENZAPRINE HCL 5 MG PO TABS: 5.0000 mg | ORAL_TABLET | Freq: Three times a day (TID) | ORAL | Status: DC | PRN

## 2014-09-25 NOTE — Discharge Instructions (Signed)

## 2014-09-25 NOTE — MAU Note (Signed)
Pt states seh threw up her dinner and "i got sick on the way here"

## 2014-09-25 NOTE — Progress Notes (Signed)
Pt states she has a headache that she rates a 9, pt states she felt dizzy when she got up to come in here

## 2014-09-25 NOTE — MAU Note (Signed)
Pt states she wants to be induced.Pt states she is having some sharp abdominal "pains that started earlier tonight I don't remember what time"

## 2014-09-25 NOTE — MAU Note (Signed)
Pt reports sharp lower abd pain for the last 45 minutes. Denies bleeding or ROM. Denies problems with the pregnancy.

## 2014-09-26 NOTE — MAU Provider Note (Signed)
History  29 yo G3P2 @ 36.2 wks presents w/ c/o sharp abdominal pains, unsure time. Reports active fetus. Denies LOF or VB.  Patient Active Problem List   Diagnosis Date Noted  . Abdominal pain 09/25/2014  . OBESITY 04/27/2008  . ANXIETY 04/27/2008  . DEPRESSION 04/27/2008  . ALLERGIC RHINITIS 04/27/2008    Chief Complaint  Patient presents with  . Abdominal Pain   HPI As above OB History    Gravida Para Term Preterm AB TAB SAB Ectopic Multiple Living   3 2 2              Past Medical History  Diagnosis Date  . Hypertension   . Hypothyroidism   . Diabetes mellitus without complication   . Depression     Past Surgical History  Procedure Laterality Date  . Wisdom tooth extraction      No family history on file.  History  Substance Use Topics  . Smoking status: Never Smoker   . Smokeless tobacco: Not on file  . Alcohol Use: No    Allergies:  Allergies  Allergen Reactions  . Sertraline Hcl Shortness Of Breath  . Cocoa Butter Rash    No prescriptions prior to admission    ROS  Ctxs Physical Exam  Gen: NAD Abd: gravid, soft, NT Pelvic: Long/closed/high FHRT: Cat 1 U/Cs: None Blood pressure 134/78, pulse 97, temperature 98.4 F (36.9 C), temperature source Oral, resp. rate 16, height 5\' 2"  (1.575 m), weight 197 lb (89.359 kg), SpO2 100 %.    Physical Exam  ED Course  Assessment: Not in labor Pain  Plan: Trial of Flexeril PTL precautions OB f/u prn   Sherre ScarletWILLIAMS, Nasier Thumm CNM, MS 09/29/2014 8:35 PM

## 2014-10-22 ENCOUNTER — Encounter (HOSPITAL_COMMUNITY): Payer: Self-pay | Admitting: *Deleted

## 2014-10-22 ENCOUNTER — Inpatient Hospital Stay (HOSPITAL_COMMUNITY): Payer: Medicaid Other | Admitting: Anesthesiology

## 2014-10-22 ENCOUNTER — Inpatient Hospital Stay (HOSPITAL_COMMUNITY)
Admission: AD | Admit: 2014-10-22 | Discharge: 2014-10-25 | DRG: 775 | Disposition: A | Discharge status: Home / Self Care | Payer: Medicaid Other | Source: Ambulatory Visit | Attending: Obstetrics & Gynecology | Admitting: Obstetrics & Gynecology

## 2014-10-22 DIAGNOSIS — E669 Obesity, unspecified: Secondary | ICD-10-CM | POA: Diagnosis present

## 2014-10-22 DIAGNOSIS — Z3A39 39 weeks gestation of pregnancy: Secondary | ICD-10-CM | POA: Diagnosis present

## 2014-10-22 DIAGNOSIS — O99214 Obesity complicating childbirth: Secondary | ICD-10-CM | POA: Diagnosis present

## 2014-10-22 DIAGNOSIS — O24424 Gestational diabetes mellitus in childbirth, insulin controlled: Principal | ICD-10-CM | POA: Diagnosis present

## 2014-10-22 DIAGNOSIS — E119 Type 2 diabetes mellitus without complications: Secondary | ICD-10-CM

## 2014-10-22 DIAGNOSIS — E039 Hypothyroidism, unspecified: Secondary | ICD-10-CM | POA: Diagnosis present

## 2014-10-22 DIAGNOSIS — I10 Essential (primary) hypertension: Secondary | ICD-10-CM | POA: Diagnosis present

## 2014-10-22 DIAGNOSIS — Z3483 Encounter for supervision of other normal pregnancy, third trimester: Secondary | ICD-10-CM | POA: Diagnosis present

## 2014-10-22 DIAGNOSIS — O133 Gestational [pregnancy-induced] hypertension without significant proteinuria, third trimester: Secondary | ICD-10-CM | POA: Diagnosis present

## 2014-10-22 DIAGNOSIS — Z6837 Body mass index (BMI) 37.0-37.9, adult: Secondary | ICD-10-CM | POA: Diagnosis not present

## 2014-10-22 LAB — GLUCOSE, CAPILLARY: GLUCOSE-CAPILLARY: 69 mg/dL — AB (ref 70–99)

## 2014-10-22 LAB — PROTEIN / CREATININE RATIO, URINE
Creatinine, Urine: 83 mg/dL
Protein Creatinine Ratio: 0.24 — ABNORMAL HIGH (ref 0.00–0.15)
Total Protein, Urine: 20 mg/dL

## 2014-10-22 LAB — COMPREHENSIVE METABOLIC PANEL
ALK PHOS: 211 U/L — AB (ref 39–117)
ALT: 17 U/L (ref 0–35)
ANION GAP: 10 (ref 5–15)
AST: 25 U/L (ref 0–37)
Albumin: 2.8 g/dL — ABNORMAL LOW (ref 3.5–5.2)
BUN: 7 mg/dL (ref 6–23)
CHLORIDE: 107 mmol/L (ref 96–112)
CO2: 20 mmol/L (ref 19–32)
Calcium: 8.6 mg/dL (ref 8.4–10.5)
Creatinine, Ser: 0.43 mg/dL — ABNORMAL LOW (ref 0.50–1.10)
GFR calc non Af Amer: >90 mL/min (ref >90)
Glucose, Bld: 80 mg/dL (ref 70–99)
POTASSIUM: 4.2 mmol/L (ref 3.5–5.1)
Sodium: 137 mmol/L (ref 135–145)
Total Bilirubin: 0.3 mg/dL (ref 0.3–1.2)
Total Protein: 6.2 g/dL (ref 6.0–8.3)

## 2014-10-22 LAB — SYPHILIS: RPR W/REFLEX TO RPR TITER AND TREPONEMAL ANTIBODIES, TRADITIONAL SCREENING AND DIAGNOSIS ALGORITHM: RPR Ser Ql: NONREACTIVE

## 2014-10-22 LAB — CBC
HCT: 30.7 % — ABNORMAL LOW (ref 36.0–46.0)
Hemoglobin: 9.4 g/dL — ABNORMAL LOW (ref 12.0–15.0)
MCH: 22.8 pg — ABNORMAL LOW (ref 26.0–34.0)
MCHC: 30.6 g/dL (ref 30.0–36.0)
MCV: 74.5 fL — ABNORMAL LOW (ref 78.0–100.0)
Platelets: 252 10*3/uL (ref 150–400)
RBC: 4.12 MIL/uL (ref 3.87–5.11)
RDW: 16.1 % — ABNORMAL HIGH (ref 11.5–15.5)
WBC: 13.4 10*3/uL — ABNORMAL HIGH (ref 4.0–10.5)

## 2014-10-22 LAB — URIC ACID: Uric Acid, Serum: 4.3 mg/dL (ref 2.4–7.0)

## 2014-10-22 LAB — LACTATE DEHYDROGENASE: LDH: 192 U/L (ref 94–250)

## 2014-10-22 MED ORDER — PHENYLEPHRINE 40 MCG/ML (10ML) SYRINGE FOR IV PUSH (FOR BLOOD PRESSURE SUPPORT)
80.0000 ug | PREFILLED_SYRINGE | INTRAVENOUS | Status: DC | PRN
  Filled 2014-10-22: qty 2
  Filled 2014-10-22: qty 20

## 2014-10-22 MED ORDER — LIDOCAINE HCL (PF) 1 % IJ SOLN
30.0000 mL | INTRAMUSCULAR | Status: DC | PRN
  Filled 2014-10-22: qty 30

## 2014-10-22 MED ORDER — OXYCODONE-ACETAMINOPHEN 5-325 MG PO TABS: 1.0000 | ORAL_TABLET | ORAL | Status: DC | PRN

## 2014-10-22 MED ORDER — MISOPROSTOL 50MCG HALF TABLET
50.0000 ug | ORAL_TABLET | ORAL | Status: DC
  Administered 2014-10-22: 50 ug via ORAL
  Filled 2014-10-22 (×7): qty 1

## 2014-10-22 MED ORDER — LABETALOL HCL 5 MG/ML IV SOLN
20.0000 mg | INTRAVENOUS | Status: DC | PRN
Start: 2014-10-22 — End: 2014-10-23

## 2014-10-22 MED ORDER — OXYCODONE-ACETAMINOPHEN 5-325 MG PO TABS: 2.0000 | ORAL_TABLET | ORAL | Status: DC | PRN

## 2014-10-22 MED ORDER — FENTANYL 2.5 MCG/ML BUPIVACAINE 1/10 % EPIDURAL INFUSION (WH - ANES)
14.0000 mL/h | INTRAMUSCULAR | Status: DC | PRN
  Administered 2014-10-22 (×2): 14 mL/h via EPIDURAL
  Filled 2014-10-22 (×2): qty 125

## 2014-10-22 MED ORDER — MISOPROSTOL 25 MCG QUARTER TABLET: 50.0000 ug | ORAL_TABLET | ORAL | Status: DC

## 2014-10-22 MED ORDER — CITRIC ACID-SODIUM CITRATE 334-500 MG/5ML PO SOLN: 30.0000 mL | ORAL | Status: DC | PRN

## 2014-10-22 MED ORDER — EPHEDRINE 5 MG/ML INJ
10.0000 mg | INTRAVENOUS | Status: DC | PRN
  Filled 2014-10-22: qty 2

## 2014-10-22 MED ORDER — PHENYLEPHRINE 40 MCG/ML (10ML) SYRINGE FOR IV PUSH (FOR BLOOD PRESSURE SUPPORT)
80.0000 ug | PREFILLED_SYRINGE | INTRAVENOUS | Status: DC | PRN
  Filled 2014-10-22: qty 2

## 2014-10-22 MED ORDER — ACETAMINOPHEN 325 MG PO TABS
650.0000 mg | ORAL_TABLET | ORAL | Status: DC | PRN
Start: 2014-10-22 — End: 2014-10-23
  Administered 2014-10-22: 650 mg via ORAL
  Filled 2014-10-22: qty 2

## 2014-10-22 MED ORDER — DIPHENHYDRAMINE HCL 50 MG/ML IJ SOLN: 12.5000 mg | INTRAMUSCULAR | Status: DC | PRN

## 2014-10-22 MED ORDER — LACTATED RINGERS IV SOLN: 500.0000 mL | Freq: Once | INTRAVENOUS | Status: DC

## 2014-10-22 MED ORDER — HYDRALAZINE HCL 20 MG/ML IJ SOLN: 10.0000 mg | Freq: Once | INTRAMUSCULAR | Status: AC | PRN

## 2014-10-22 MED ORDER — LACTATED RINGERS IV SOLN
INTRAVENOUS | Status: DC
  Administered 2014-10-22: 07:00:00 via INTRAVENOUS

## 2014-10-22 MED ORDER — NALBUPHINE HCL 10 MG/ML IJ SOLN
10.0000 mg | INTRAMUSCULAR | Status: DC | PRN
  Administered 2014-10-22 (×3): 10 mg via INTRAVENOUS
  Filled 2014-10-22 (×3): qty 1

## 2014-10-22 MED ORDER — TERBUTALINE SULFATE 1 MG/ML IJ SOLN: 0.2500 mg | Freq: Once | INTRAMUSCULAR | Status: AC | PRN

## 2014-10-22 MED ORDER — LIDOCAINE HCL (PF) 1 % IJ SOLN
INTRAMUSCULAR | Status: DC | PRN
  Administered 2014-10-22 (×2): 5 mL

## 2014-10-22 MED ORDER — OXYTOCIN BOLUS FROM INFUSION: 500.0000 mL | INTRAVENOUS | Status: DC

## 2014-10-22 MED ORDER — OXYTOCIN 40 UNITS IN LACTATED RINGERS INFUSION - SIMPLE MED: 62.5000 mL/h | INTRAVENOUS | Status: DC

## 2014-10-22 MED ORDER — ONDANSETRON HCL 4 MG/2ML IJ SOLN
4.0000 mg | Freq: Four times a day (QID) | INTRAMUSCULAR | Status: DC | PRN
  Administered 2014-10-22 (×2): 4 mg via INTRAVENOUS
  Filled 2014-10-22 (×2): qty 2

## 2014-10-22 MED ORDER — LACTATED RINGERS IV SOLN: 500.0000 mL | INTRAVENOUS | Status: DC | PRN

## 2014-10-22 MED ORDER — OXYTOCIN 40 UNITS IN LACTATED RINGERS INFUSION - SIMPLE MED
1.0000 m[IU]/min | INTRAVENOUS | Status: DC
  Filled 2014-10-22: qty 1000

## 2014-10-22 NOTE — MAU Note (Signed)
Pt to be admitted.

## 2014-10-22 NOTE — Progress Notes (Signed)
Labor Progress  Subjective: Comfortable with epidural, still very anxious about VE  Objective: BP 146/79 mmHg  Pulse 97  Temp(Src) 97.5 F (36.4 C) (Oral)  Resp 16  Ht 5\' 2"  (1.575 m)  Wt 205 lb (92.987 kg)  BMI 37.49 kg/m2  SpO2 100%     FHT: 130, moderate variability, + accel, no decel CTX:  regular, every 3 minutes Uterus gravid, soft non tender SVE:  Dilation: 5 Effacement (%): 50 Station: -3 Exam by:: [name],cnm Pitocin at 788mUn/min  Assessment:  IUP at 39.4 weeks NICHD: Category 1 Membranes:  SROM x 12hrs, no s/s of infection Labor progress: IOL Pitocin Augmentation GBS: negative    Plan: Continue labor plan Continuous monitoring Rest Frequent position changes to facilitate fetal rotation and descent. Continue pitocin per protocol     Cynthia Foster, CNM, MSN 10/22/2014. 6:15 PM

## 2014-10-22 NOTE — Anesthesia Preprocedure Evaluation (Signed)
Anesthesia Evaluation  Patient identified by MRN, date of birth, ID band Patient awake    Reviewed: Allergy & Precautions, H&P , Patient's Chart, lab work & pertinent test results  Airway Mallampati: II  TM Distance: >3 FB Neck ROM: full    Dental   Pulmonary  breath sounds clear to auscultation        Cardiovascular Rhythm:regular Rate:Normal     Neuro/Psych    GI/Hepatic   Endo/Other  Hypothyroidism   Renal/GU      Musculoskeletal   Abdominal   Peds  Hematology   Anesthesia Other Findings DM and HTN with last pregnancy none with this one  Reproductive/Obstetrics (+) Pregnancy                             Anesthesia Physical Anesthesia Plan  ASA: II  Anesthesia Plan: Epidural   Post-op Pain Management:    Induction:   Airway Management Planned:   Additional Equipment:   Intra-op Plan:   Post-operative Plan:   Informed Consent: I have reviewed the patients History and Physical, chart, labs and discussed the procedure including the risks, benefits and alternatives for the proposed anesthesia with the patient or authorized representative who has indicated his/her understanding and acceptance.     Plan Discussed with:   Anesthesia Plan Comments:         Anesthesia Quick Evaluation

## 2014-10-22 NOTE — Progress Notes (Signed)
Cynthia Savageiffany N Scadden MRN: [id number]  Subjective: -Patient reports rectal pressure, but no urge to push.  Reports desire to sleep and seems agitated.  Objective: BP 141/77 mmHg  Pulse 103  Temp(Src) 98 F (36.7 C) (Axillary)  Resp 16  Ht 5\' 2"  (1.575 m)  Wt 205 lb (92.987 kg)  BMI 37.49 kg/m2  SpO2 100%     FHT:155 bpm, Mod Var, -Decels, +Accels UC: Unable to graph, palpates moderate    SVE:   Dilation: 10 Effacement (%): 50 Station: +1, 0 Exam by:: Jemly, CNM Membranes: SROM x  Pitocin: 559mUn/min  Assessment:  IUP at 39.4wks Cat I FT  Pitocin Augmentation 2nd Stage  Plan: -Allow patient time to labor down and attempt to rest -Placed in high fowlers to faciliate fetal descent -Will reassess in 30-45 minutes and start pushing -Continue other mgmt as ordered  Cynthia Starry LYNN,MSN, CNM 10/22/2014, 10:50 PM

## 2014-10-22 NOTE — Progress Notes (Signed)
Cynthia Savageiffany N Irani MRN: [id number]  Subjective: -Patient reports bothersome itching with epidural.  Reports some sensation of contractions and more pain on left side.    Objective: BP 132/100 mmHg  Pulse 108  Temp(Src) 98 F (36.7 C) (Axillary)  Resp 16  Ht 5\' 2"  (1.575 m)  Wt 205 lb (92.987 kg)  BMI 37.49 kg/m2  SpO2 100%     FHT: 145 bpm, Mod Var, -Decels, +Accels UC:   Q1-243min, palpates moderate SVE:   Dilation: 9 Effacement (%): 50 Station: 0 Exam by:: Jemly, CNM  Bloody Show Membranes: SROM Pitocin: 949mUn/min  Assessment:  IUP at 39.4wks Cat I FT Pitocin Augmentation Transition Phase  Pruritis   Plan: -Discussed position changes to promote fetal descent and rotation  -Nubain 5mg  for itching -Continue other mgmt as ordered -Dr. Katharine LookJ. Ozan updated on patient progress  Cynthia Schall LYNN,MSN, CNM 10/22/2014, 9:15 PM

## 2014-10-22 NOTE — Anesthesia Procedure Notes (Signed)
Epidural Patient location during procedure: OB Start time: 10/22/2014 4:24 PM  Staffing Anesthesiologist: Brayton CavesJACKSON, Amelya Mabry Performed by: anesthesiologist   Preanesthetic Checklist Completed: patient identified, site marked, surgical consent, pre-op evaluation, timeout performed, IV checked, risks and benefits discussed and monitors and equipment checked  Epidural Patient position: sitting Prep: site prepped and draped and DuraPrep Patient monitoring: continuous pulse ox and blood pressure Approach: midline Location: L3-L4 Injection technique: LOR air  Needle:  Needle type: Tuohy  Needle gauge: 17 G Needle length: 9 cm and 9 Needle insertion depth: 6 cm Catheter type: closed end flexible Catheter size: 19 Gauge Catheter at skin depth: 12 cm Test dose: negative  Assessment Events: blood not aspirated, injection not painful, no injection resistance, negative IV test and no paresthesia  Additional Notes Patient identified.  Risk benefits discussed including failed block, incomplete pain control, headache, nerve damage, paralysis, blood pressure changes, nausea, vomiting, reactions to medication both toxic or allergic, and postpartum back pain.  Patient expressed understanding and wished to proceed.  All questions were answered.  Sterile technique used throughout procedure and epidural site dressed with sterile barrier dressing. No paresthesia or other complications noted.The patient did not experience any signs of intravascular injection such as tinnitus or metallic taste in mouth nor signs of intrathecal spread such as rapid motor block. Please see nursing notes for vital signs.

## 2014-10-22 NOTE — H&P (Signed)
Cynthia Foster is a 29 y.o. female, G3P2002 at 39.4 weeks, presenting for SROM.  Patient reports gush of fluid at 0450 and contractions.  Patient denies VB, but reports good fetal movement.  Patient is GBS negative and is a DM type II, Chronic HTN, Hypothyroidism, and Chronic Anxiety.    Patient Active Problem List   Diagnosis Date Noted  . Abdominal pain 09/25/2014  . OBESITY 04/27/2008  . ANXIETY 04/27/2008  . DEPRESSION 04/27/2008  . ALLERGIC RHINITIS 04/27/2008    History of present pregnancy: Patient entered care at 20 weeks.   EDC of 10/25/2014 was established by Definite LMP of 01/18/2014.   Anatomy scan:  20.5 weeks, with abnormal findings as dolicocephaly noted and an posterior placenta.   Additional US evaluations:   26.5wks: EFW 1 lb 10 oz. = 78%tile. Vertex pres. Posterior-fundal placenta. Nl Placental cord insertion. WNL AFI= AP pocket=5.0 cm.. WNL growth. Anatomy : fetal spine seen today. Placental cord insertion seen. Anatomy is complete.. Cx closed. 33.6wks:  EFW: 6lb 1 oz 89.8% cx 4.00 cm cx closed AFI: 16.62 cm 60th% Anterior fundal placenta 38.6wks: EFW: 9 lbs 12 oz 98%tile AFI: 20.0 cm- 85th%tile AC measurement is off the chart- greater than 42 weeks Significant prenatal events:  [facility],  Started seeing therapist at [facility] for unmanaged (x369months) anxiety at 20 wks. Patient with c/o increased stress from social issues and required increase in wellbutrin at 26.5wks.  Patient also with common pregnancy complaints including NV, edema, abdominal pain, and insomnia Last evaluation:  10/17/2014 by Dr. Kathie RhodesS. Rivard, SVE: 1/20/-3, FHR 146, BP 114/70, Wt 205lbs  OB History    Gravida Para Term Preterm AB TAB SAB Ectopic Multiple Living   3 2 0 2      2     Past Medical History  Diagnosis Date  . Hypertension   . Hypothyroidism   . Diabetes mellitus without complication   . Depression    Past Surgical History  Procedure Laterality Date  . Wisdom tooth  extraction     Family History: family history is not on file. Social History:  reports that she has never smoked. She does not have any smokeless tobacco history on file. She reports that she does not drink alcohol or use illicit drugs.   Prenatal Transfer Tool  Maternal Diabetes: Yes:  Diabetes Type:  Diet controlled Genetic Screening: None due to late entry to care Maternal Ultrasounds/Referrals: Normal Fetal Ultrasounds or other Referrals:  None Maternal Substance Abuse:  No Significant Maternal Medications:  Meds include: Other: Wellbutrin, Flexeril, Ambien, and Protonix Significant Maternal Lab Results: Lab values include: Group B Strep negative    ROS:  +LOF, -VB, +FM, +Ctx--Denies visual disturbances, HA, epigastric pain, and numbness/tingling  Allergies  Allergen Reactions  . Sertraline Hcl Shortness Of Breath  . Cocoa Butter Rash       Blood pressure 127/88, pulse 110, temperature 97.6 F (36.4 C), temperature source Oral, resp. rate 18, height 5\' 2"  (1.575 m), weight 205 lb (92.987 kg).  Physical Exam General: No distress Chest: Lungs CTA, Heart RRR Abdomen: Soft,+ BS  Pelvic:1/50/-3 DC >13cm Vertex by Exam EFW 9lb 12 oz by 38 wk US Skin: Warm, Dry Ext: Some pitting edema of feet  FHR: 135 bpm, Mod Var, -Decels, +Accels UCs:  Q2-484min  Prenatal labs: ABO, Rh:  A Negative Antibody:  Negative Rubella:   Immune RPR:   NR HBsAg:   Negative HIV:   NR GBS:  Negative Sickle cell/Hgb electrophoresis:  N/A Pap:  Normal GC:  Negative Chlamydia:  Negative Genetic screenings:  None Glucola:  Normal Other:  TSH: Normal, T3-Normal, Hgb A1c-Normal, UA-Normal, LDH-Normal, t4-Normal    Assessment IUP at 39.4wks Cat I FT CHTN DM Type II Hypothyroidism Anxiety LGA Infant GBS Negative SROM  Plan: Admit to [facility] per consult with Dr.  Routine Labor and Delivery Orders per [facility] Protocol Okay for epidural if desired  Phillips Climes,  MSN 10/22/2014, 6:19 AM

## 2014-10-22 NOTE — MAU Note (Signed)
Pt states that her membranes ruptured at 0450 for clear fluid. Pt states that baby is active and denies bleeding.

## 2014-10-22 NOTE — Progress Notes (Addendum)
Labor Progress  Subjective: Very anxious  Objective: BP 134/90 mmHg  Pulse 98  Temp(Src) 98.5 F (36.9 C) (Axillary)  Resp 18  Ht 5\' 2"  (1.575 m)  Wt 205 lb (92.987 kg)  BMI 37.49 kg/m2     FHT: 145 moderate variability, + accel, no decel CTX:  Occasionally, unaware Uterus gravid, soft non tender SVE:  1/40/-3 firm and very posterior   Assessment:  IUP at 39.4 weeks NICHD: Category 1 Membranes:  SROM x 4hrs, no s/s of infection Labor progress: early GBS: negative   Plan: Continue labor plan Continuous monitoring Ambulate Frequent position changes to facilitate fetal rotation and descent. Will reassess with cervical exam at 1300 or earlier if necessary PO cytotec     Cynthia Foster, CNM, MSN 10/22/2014. 9:01 AM

## 2014-10-23 ENCOUNTER — Encounter (HOSPITAL_COMMUNITY): Payer: Self-pay | Admitting: *Deleted

## 2014-10-23 DIAGNOSIS — E039 Hypothyroidism, unspecified: Secondary | ICD-10-CM | POA: Diagnosis present

## 2014-10-23 DIAGNOSIS — I10 Essential (primary) hypertension: Secondary | ICD-10-CM | POA: Diagnosis present

## 2014-10-23 DIAGNOSIS — E119 Type 2 diabetes mellitus without complications: Secondary | ICD-10-CM

## 2014-10-23 LAB — CBC
HCT: 27.1 % — ABNORMAL LOW (ref 36.0–46.0)
Hemoglobin: 8.3 g/dL — ABNORMAL LOW (ref 12.0–15.0)
MCH: 22.9 pg — ABNORMAL LOW (ref 26.0–34.0)
MCHC: 30.6 g/dL (ref 30.0–36.0)
MCV: 74.9 fL — ABNORMAL LOW (ref 78.0–100.0)
PLATELETS: 220 10*3/uL (ref 150–400)
RBC: 3.62 MIL/uL — ABNORMAL LOW (ref 3.87–5.11)
RDW: 16 % — ABNORMAL HIGH (ref 11.5–15.5)
WBC: 26.7 10*3/uL — ABNORMAL HIGH (ref 4.0–10.5)

## 2014-10-23 MED ORDER — LANOLIN HYDROUS EX OINT: TOPICAL_OINTMENT | CUTANEOUS | Status: DC | PRN

## 2014-10-23 MED ORDER — OXYCODONE-ACETAMINOPHEN 5-325 MG PO TABS
2.0000 | ORAL_TABLET | ORAL | Status: DC | PRN
Start: 2014-10-23 — End: 2014-10-25
  Administered 2014-10-23 – 2014-10-25 (×9): 2 via ORAL
  Filled 2014-10-23 (×10): qty 2

## 2014-10-23 MED ORDER — SIMETHICONE 80 MG PO CHEW
80.0000 mg | CHEWABLE_TABLET | ORAL | Status: DC | PRN
  Administered 2014-10-23 (×2): 80 mg via ORAL
  Filled 2014-10-23 (×2): qty 1

## 2014-10-23 MED ORDER — ONDANSETRON HCL 4 MG PO TABS: 4.0000 mg | ORAL_TABLET | ORAL | Status: DC | PRN

## 2014-10-23 MED ORDER — RHO D IMMUNE GLOBULIN 1500 UNIT/2ML IJ SOSY
300.0000 ug | PREFILLED_SYRINGE | Freq: Once | INTRAMUSCULAR | Status: AC
  Administered 2014-10-23: 300 ug via INTRAVENOUS
  Filled 2014-10-23: qty 2

## 2014-10-23 MED ORDER — TETANUS-DIPHTH-ACELL PERTUSSIS 5-2.5-18.5 LF-MCG/0.5 IM SUSP: 0.5000 mL | Freq: Once | INTRAMUSCULAR | Status: DC

## 2014-10-23 MED ORDER — DIPHENHYDRAMINE HCL 25 MG PO CAPS
25.0000 mg | ORAL_CAPSULE | Freq: Four times a day (QID) | ORAL | Status: DC | PRN
  Filled 2014-10-23: qty 1

## 2014-10-23 MED ORDER — WITCH HAZEL-GLYCERIN EX PADS: 1.0000 "application " | MEDICATED_PAD | CUTANEOUS | Status: DC | PRN

## 2014-10-23 MED ORDER — BUPROPION HCL ER (XL) 300 MG PO TB24
300.0000 mg | ORAL_TABLET | Freq: Every day | ORAL | Status: DC
  Administered 2014-10-23 – 2014-10-24 (×2): 300 mg via ORAL
  Filled 2014-10-23 (×3): qty 1

## 2014-10-23 MED ORDER — PRENATAL MULTIVITAMIN CH
1.0000 | ORAL_TABLET | Freq: Every day | ORAL | Status: DC
  Administered 2014-10-23: 1 via ORAL
  Filled 2014-10-23 (×3): qty 1

## 2014-10-23 MED ORDER — ACETAMINOPHEN 325 MG PO TABS: 650.0000 mg | ORAL_TABLET | ORAL | Status: DC | PRN

## 2014-10-23 MED ORDER — ONDANSETRON HCL 4 MG/2ML IJ SOLN: 4.0000 mg | INTRAMUSCULAR | Status: DC | PRN

## 2014-10-23 MED ORDER — BENZOCAINE-MENTHOL 20-0.5 % EX AERO
1.0000 "application " | INHALATION_SPRAY | CUTANEOUS | Status: DC | PRN
  Administered 2014-10-23 – 2014-10-25 (×2): 1 via TOPICAL
  Filled 2014-10-23 (×2): qty 56

## 2014-10-23 MED ORDER — SENNOSIDES-DOCUSATE SODIUM 8.6-50 MG PO TABS
2.0000 | ORAL_TABLET | ORAL | Status: DC
  Administered 2014-10-24: 2 via ORAL
  Filled 2014-10-23 (×2): qty 2

## 2014-10-23 MED ORDER — DIBUCAINE 1 % RE OINT: 1.0000 "application " | TOPICAL_OINTMENT | RECTAL | Status: DC | PRN

## 2014-10-23 MED ORDER — OXYCODONE-ACETAMINOPHEN 5-325 MG PO TABS
1.0000 | ORAL_TABLET | ORAL | Status: DC | PRN
Start: 2014-10-23 — End: 2014-10-25
  Administered 2014-10-23 (×2): 1 via ORAL

## 2014-10-23 MED ORDER — ZOLPIDEM TARTRATE 5 MG PO TABS: 5.0000 mg | ORAL_TABLET | Freq: Every evening | ORAL | Status: DC | PRN

## 2014-10-23 MED ORDER — IBUPROFEN 600 MG PO TABS
600.0000 mg | ORAL_TABLET | Freq: Four times a day (QID) | ORAL | Status: DC
  Administered 2014-10-23 – 2014-10-25 (×11): 600 mg via ORAL
  Filled 2014-10-23 (×11): qty 1

## 2014-10-23 NOTE — Progress Notes (Signed)
Cynthia Foster   Subjective: Post Partum Day 0 Vaginal delivery, right labial laceration Patient up ad lib, denies syncope or dizziness. Reports consuming regular diet without issues and denies N/V No issues with urination and reports bleeding is appropriate  Feeding:  breast Contraceptive plan:   unsure  Objective: Temp:  [97 F (36.1 C)-98.4 F (36.9 C)] 98 F (36.7 C) (03/22 0645) Pulse Rate:  [91-133] 91 (03/22 0645) Resp:  [16-98] 18 (03/22 0645) BP: (105-153)/(67-103) 130/80 mmHg (03/22 0645) SpO2:  [99 %-100 %] 99 % (03/22 0300)  Physical Exam:  General: alert and cooperative Ext: WNL, no edema. No evidence of DVT seen on physical exam. Breast: Soft filling Lungs: CTAB Heart RRR without murmur  Abdomen:  Soft, fundus firm, lochia scant, + bowel sounds, non distended, non tender Lochia: appropriate Uterine Fundus: firm Laceration: healing well    Recent Labs  10/22/14 0640 10/23/14 0610  HGB 9.4* 8.3*  HCT 30.7* 27.1*    Assessment S/P Vaginal Delivery-Day 0 Stable  Normal Involution Breastfeeding/Bottlefeeding Circumcision: out patient  Plan: Continue current care Breastfeeding, Lactation consult and Social Work consult Lactation support   Cynthia Foster, CNM, MSN 10/23/2014, 10:11 AM

## 2014-10-23 NOTE — Progress Notes (Signed)
UR chart review completed.  

## 2014-10-23 NOTE — Anesthesia Postprocedure Evaluation (Signed)
Anesthesia Post Note  Patient: Cynthia Foster  Procedure(s) Performed: * No procedures listed *  Anesthesia type: Epidural  Patient location: Mother/Baby  Post pain: Pain level controlled  Post assessment: Post-op Vital signs reviewed  Last Vitals:  Filed Vitals:   10/23/14 0645  BP: 130/80  Pulse: 91  Temp: 36.7 C  Resp: 18    Post vital signs: Reviewed  Level of consciousness:alert  Complications: No apparent anesthesia complications
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
## 2014-10-24 ENCOUNTER — Encounter (HOSPITAL_COMMUNITY): Payer: Self-pay | Admitting: Obstetrics and Gynecology

## 2014-10-24 LAB — GLUCOSE, CAPILLARY
GLUCOSE-CAPILLARY: 123 mg/dL — AB (ref 70–99)
Glucose-Capillary: 67 mg/dL — ABNORMAL LOW (ref 70–99)

## 2014-10-24 LAB — RH IG WORKUP (INCLUDES ABO/RH)
ABO/RH(D): A NEG
Fetal Screen: NEGATIVE
Gestational Age(Wks): 39
Unit division: 0

## 2014-10-24 LAB — CBC
HCT: 27.6 % — ABNORMAL LOW (ref 36.0–46.0)
HEMOGLOBIN: 8.3 g/dL — AB (ref 12.0–15.0)
MCH: 22.9 pg — AB (ref 26.0–34.0)
MCHC: 30.1 g/dL (ref 30.0–36.0)
MCV: 76 fL — AB (ref 78.0–100.0)
Platelets: 234 10*3/uL (ref 150–400)
RBC: 3.63 MIL/uL — ABNORMAL LOW (ref 3.87–5.11)
RDW: 16.1 % — AB (ref 11.5–15.5)
WBC: 18.5 10*3/uL — ABNORMAL HIGH (ref 4.0–10.5)

## 2014-10-24 MED ORDER — PAROXETINE HCL 20 MG PO TABS
40.0000 mg | ORAL_TABLET | Freq: Every day | ORAL | Status: DC
  Administered 2014-10-25: 40 mg via ORAL
  Filled 2014-10-24 (×2): qty 2

## 2014-10-24 NOTE — Progress Notes (Addendum)
No return phone call received from CPS worker.  CSW left second message at 8:30am.    CSW followed up with MOB in order to continue to provide support.  MOB continues to present as anxious and focused on the open CPS case.  CSW provided MOB opportunity to continue to express her feelings, and she continues to voice frustration and feeling overwhelmed.  MOB was more easily re-directed today in comparison to yesterday.  She was able to discuss her able to distract herself from the stress yesterday by focusing on her visits from her family.  MOB was encouraged to continue to identify various ways to self-regulate.  MOB indicated interest in discontinuing Wellbutrin and re-starting Paxil since she notes Paxil helps to reduce anxiety.  MOB denied history of postpartum depression, but acknowledged increased risk due to mental health history and current stress.  Overall, MOB presents with insight about need to self-regulate and how anxiety impacts her ability to parent.  She recognizes need to reduce stress but often struggles to self-regulate.   CSW will continue to follow and will continue to attempt to make contact with CPS worker.  Update: [facility].  CPS worker denied barriers to infant being discharged to the care of the mother.  He stated that he still wants to see the infant at the hospital in order to "lay eyes on him".    CSW followed up with the MOB.  She presented as less anxious and overwhelmed.  MOB reported that her CPS worker had just visited, and she stated that he will be closely her week in upcoming weeks.  She expressed gratitude to CSW for assistance while at the hospital, and shared that it has been a "sense of relief" knowing that the end is in sight.  CSW also noted that MOB will be re-starting Paxil and discontinuing Wellbutrin.  MOB shared that she appreciates the CNM who assisted with this change, and hopes that it will help her to reduce anxiety.   No  barriers to discharge.

## 2014-10-24 NOTE — Progress Notes (Signed)
Pt c/o feeling shaky. Has hx of Type 2 diabetes but on no meds. CBG checked and was 67. Juice given and pt was eating breakfast. Rechecked blood sugar & it came up to 123. Midwife notified of events. Will continue to monitor.

## 2014-10-24 NOTE — Progress Notes (Addendum)
Subjective: Postpartum Day 1: Vaginal delivery, shoulder dystocia, no laceration Patient up ad lib, reports no syncope or dizziness. Feeding:  Bottle Contraceptive plan:  Nexplanon Now planning to use only Motrin for pain.  Wants to restart Paxil (used prior to pregnancy, "works better than Welbutrin for my anxiety".  Patient reports she was on 40 mg daily.  Still very concerned about CPS involvement with her family--has seen SW during this hospitalization. Seems more settled and calm today.  Had episode of low blood sugar this am, resolved with intake, with normalization of CBG.  Baby O+.  Patient candidate for Rhophylac.  Objective: Vital signs in last 24 hours: Temp:  [97.6 F (36.4 C)-98 F (36.7 C)] 97.6 F (36.4 C) (03/23 0639) Pulse Rate:  [88-97] 88 (03/23 0639) Resp:  [18] 18 (03/23 0639) BP: (107-127)/(77-87) 107/77 mmHg (03/23 16100639)   Filed Vitals:   10/23/14 0645 10/23/14 1115 10/23/14 1900 10/24/14 0639  BP: 130/80 133/93 127/87 107/77  Pulse: 91 105 97 88  Temp: 98 F (36.7 C) 97.6 F (36.4 C) 98 F (36.7 C) 97.6 F (36.4 C)  TempSrc: Oral Axillary Oral Oral  Resp: 18 20 18 18   Height:      Weight:      SpO2:  100%      Physical Exam:  General: alert Lochia: appropriate Uterine Fundus: firm Perineum: Perineum intact DVT Evaluation: No evidence of DVT seen on physical exam. Negative Homan's sign.   CBC Latest Ref Rng 10/24/2014 10/23/2014 10/22/2014  WBC 4.0 - 10.5 K/uL 18.5(H) 26.7(H) 13.4(H)  Hemoglobin 12.0 - 15.0 g/dL 8.3(L) 8.3(L) 9.4(L)  Hematocrit 36.0 - 46.0 % 27.6(L) 27.1(L) 30.7(L)  Platelets 150 - 400 K/uL 234 220 252   CBG (last 3)   Recent Labs  10/22/14 0826 10/24/14 1000 10/24/14 1037  GLUCAP 69* 67* 123*    Assessment/Plan: Status post vaginal delivery day 1. Anemia without hemodynamic instability--has been intolerant of oral Fe in past.  Chronic hypertension--no meds Type 2 DM--no meds Hypothyroidism--no  meds Anxiety--on med Stable Continue current care. Plan for discharge tomorrow  Start Paxil 40 mg po daily tomorrow (already took Welbutrin today). Support to patient for issues and concerns. Increase Fe rich foods. Rhophylac    Reina Wilton, VICKICNM 10/24/2014, 12:17 PM

## 2014-10-24 NOTE — Progress Notes (Signed)
Hypoglycemic Event  CBG:67  Treatment: 15 GM carbohydrate snack  Symptoms: Shaky  Follow-up CBG: Time:1030 CBG Result:123  Possible Reasons for Event: Inadequate meal intake  Comments/MD notified:Vickie Emilee HeroLatham, CNM    Marylyn Ishih[place], Shaundra Fullam Jeaninne Lodico  Remember to initiate Hypoglycemia Order Set & complete

## 2014-10-25 ENCOUNTER — Inpatient Hospital Stay (HOSPITAL_COMMUNITY): Admission: RE | Admit: 2014-10-25 | Payer: Medicaid Other | Source: Ambulatory Visit

## 2014-10-25 MED ORDER — PAROXETINE HCL 40 MG PO TABS: 40.0000 mg | ORAL_TABLET | Freq: Every day | ORAL | Status: DC

## 2014-10-25 MED ORDER — OXYCODONE-ACETAMINOPHEN 5-325 MG PO TABS: 1.0000 | ORAL_TABLET | ORAL | Status: DC | PRN

## 2014-10-25 MED ORDER — IBUPROFEN 600 MG PO TABS
600.0000 mg | ORAL_TABLET | Freq: Four times a day (QID) | ORAL | Status: DC | PRN
Start: 2014-10-25 — End: 2018-06-03

## 2014-10-25 NOTE — Discharge Summary (Signed)
Vaginal Delivery Discharge Summary  Cynthia Foster  DOB:    1986-01-29 MRN:    [id number] CSN:    [id number]  Date of admission:                  10/22/14  Date of discharge:                   10/25/14  Procedures this admission:   SVB   Date of Delivery: 10/23/14  Newborn Data:  Live born female  Birth Weight: 9 lb 2 oz (4140 g) APGAR: 4, 9  Home with mother. Name: [name] Circumcision Plan: Outpatient  History of Present Illness:  Ms. Cynthia Foster is a 29 y.o. female, [phone], who presents at [redacted]w[redacted]d weeks gestation. The patient has been followed at the [facility] Gy[facility]cology division of [facility]. She was admitted rupture of membranes. Her pregnancy has been complicated by:  Patient Active Problem List   Diagnosis Date Noted  . SVD (spontaneous vaginal delivery) 10/23/2014  . Shoulder dystocia, delivered, current hospitalization 10/23/2014  . Chronic hypertension 10/23/2014  . Hypothyroidism 10/23/2014  . Diabetes mellitus type 2, controlled 10/23/2014  . Abdominal pain 09/25/2014  . OBESITY 04/27/2008  . ANXIETY 04/27/2008  . DEPRESSION 04/27/2008  . ALLERGIC RHINITIS 04/27/2008     Hospital Course:  Admitted 10/22/14 with SROM, with anticipated LGA fetus, in early labor. Had significant anxiety issues during labor.  Negative GBS. Progressed with pitocin augmentation. Utilized epidural for pain management.  Delivery was performed by Gerrit Heck, CNM, without complication. Moderate shoulder dystocia was noted, with delivery accomplished by McRoberts maneuver and suprapubic pressure.  Code APGAR was called, with baby responding quickly to interventions. No lacerations noted. Infant status was deemed stable after NICU team care and remained in room with mother.  Mother and infant then had a stable postpartum course--patient requested return to Paxil after delivery, so Welbutrin was d/c'd, and Paxil 40 mg daily was initiated.  CBGs  remained stable during her hospitalization, and patient remained normotensive.  Hgb postpartum was 8.3 (down from 9.4 pre-delivery), without hemodynamic instability. WBC count was elevated to 26.7 on day 0, but resolved to 18.5 on day 1. Patient was planning to bottlefeed, but on day of d/c, was considering breastfeeding, which would be acceptable with Paxil use.  Mom's physical exam was WNL, and she was discharged home on day 2 in stable condition. She did received Rhophylac prior to delivery.  Contraception plan was Nexplanon.  She received adequate benefit from po pain medications, with Motrin and Percocet used with benefit.  SW saw the patient twice during her pp stay, due to significant patient anxiety and social issues (FOB incarcerated, open CPS file).  Patient reported intolerance to po Fe supplements, so she was recommended to increase intake of Fe rich foods.   Feeding:  breast and bottle  Contraception:  Nexplanon  Discharge hemoglobin: CBC Latest Ref Rng 10/24/2014 10/23/2014 10/22/2014  WBC 4.0 - 10.5 K/uL 18.5(H) 26.7(H) 13.4(H)  Hemoglobin 12.0 - 15.0 g/dL 8.3(L) 8.3(L) 9.4(L)  Hematocrit 36.0 - 46.0 % 27.6(L) 27.1(L) 30.7(L)  Platelets 150 - 400 K/uL 234 220 252     Discharge Physical Exam:   General: alert Lochia: appropriate Uterine Fundus: firm Incision: Perineum clear DVT Evaluation: No evidence of DVT seen on physical exam. Negative Homan's sign.  Intrapartum Procedures: spontaneous vaginal delivery, shoulder dystocia maneuvers Postpartum Procedures: Rho(D) Ig Complications-Operative and Postpartum: anemia, anxiety  Discharge Diagnoses: Term  Pregnancy-delivered and HTN, Type 2 DM, LGA fetus  Discharge Information:  Activity:           pelvic rest Diet:                routine Medications: Ibuprofen, Percocet and Paxil Condition:      stable Instructions:     Discharge to: home  Follow-up Information    [facility]. Schedule an appointment as soon as possible for a visit in 6 weeks.   Specialty:  Obstetrics and Gynecology   Why:  Call for any questions or concerns.  Call [facility] to set up circumcision appt.   Contact information:   [address] [place] [phone]       Nigel BridgemanLATHAM, [employer] [facility] Hospital[facility] 10/25/2014 8:36 AM

## 2014-10-25 NOTE — Discharge Instructions (Signed)

## 2014-10-25 NOTE — Lactation Note (Signed)
This note was copied from the chart of Cynthia Foster. Lactation Consultation Note  Patient Name: Cynthia Foster ZOXWR'UToday's Date: 10/25/2014 Reason for consult: Initial assessment Mom is being d/c's and has decided she may try to BF. Baby recently fed and asleep at this visit. Mom also will be taking Paxil and wanted to know if this was compatible with breastfeeding. Paxil is L2 per [place] and reviewed information with Mom. Basic teaching reviewed with Mom. Advised baby needs to be at the breast on demand at least 8-12 times in 24 hours. Mom reports she wants to formula feed as well. LC reviewed the importance of BF with each feeding before giving supplements to prevent engorgement and protect milk supply. Mom reports she is not sure she can manage this schedule. Mom reports her plan is to try baby at the breast a few times today and if he will take the breast she may continue to BF if not she will stay with formula/bottle feeding. LC reviewed engorgement care with Mom as her breasts are becoming very full. Advised she will need to pre-pump to release some milk before trying to latch baby. Hand pump demonstrated and flange changed to size 27. Advised if she does not BF use support bra and cabbage leaves to dry milk. Lactation brochure given for review, advised of OP services and support group. Encouraged to call for questions/concerns.   Maternal Data Has patient been taught Hand Expression?: Yes Does the patient have breastfeeding experience prior to this delivery?: No  Feeding Feeding Type: Bottle Fed - Formula  LATCH Score/Interventions                      Lactation Tools Discussed/Used Tools: Pump;Flanges Flange Size: 27 Breast pump type: Manual   Consult Status Consult Status: Complete    [name], [name] 10/25/2014, 1:57 PM

## 2014-10-26 LAB — TYPE AND SCREEN
ABO/RH(D): A NEG
Antibody Screen: POSITIVE
DAT, IGG: NEGATIVE
UNIT DIVISION: 0
Unit division: 0

## 2014-12-06 ENCOUNTER — Inpatient Hospital Stay (HOSPITAL_COMMUNITY)
Admission: AD | Admit: 2014-12-06 | Discharge: 2014-12-06 | Disposition: A | Discharge status: Home / Self Care | Payer: Medicaid Other | Source: Ambulatory Visit | Attending: Obstetrics and Gynecology | Admitting: Obstetrics and Gynecology

## 2014-12-06 DIAGNOSIS — Z30432 Encounter for removal of intrauterine contraceptive device: Secondary | ICD-10-CM | POA: Insufficient documentation

## 2014-12-06 DIAGNOSIS — M549 Dorsalgia, unspecified: Secondary | ICD-10-CM | POA: Diagnosis not present

## 2014-12-06 LAB — URINE MICROSCOPIC-ADD ON

## 2014-12-06 LAB — URINALYSIS, ROUTINE W REFLEX MICROSCOPIC
Bilirubin Urine: NEGATIVE
GLUCOSE, UA: NEGATIVE mg/dL
Ketones, ur: NEGATIVE mg/dL
LEUKOCYTES UA: NEGATIVE
NITRITE: NEGATIVE
Protein, ur: NEGATIVE mg/dL
SPECIFIC GRAVITY, URINE: 1.02 (ref 1.005–1.030)
Urobilinogen, UA: 0.2 mg/dL (ref 0.0–1.0)
pH: 5.5 (ref 5.0–8.0)

## 2014-12-06 NOTE — MAU Note (Signed)
Pt states she had an IUD placed at 10am today, states she has had back pain since then and the can see the strings hanging out of her vagina.

## 2014-12-06 NOTE — Discharge Instructions (Signed)
Office will call you to discuss birth control plans.

## 2018-06-02 ENCOUNTER — Other Ambulatory Visit: Payer: Self-pay

## 2018-06-02 ENCOUNTER — Inpatient Hospital Stay (HOSPITAL_COMMUNITY): Payer: Self-pay

## 2018-06-02 ENCOUNTER — Inpatient Hospital Stay (HOSPITAL_COMMUNITY)
Admission: AD | Admit: 2018-06-02 | Discharge: 2018-06-03 | Disposition: A | Discharge status: Home / Self Care | Payer: Self-pay | Source: Ambulatory Visit | Attending: Obstetrics & Gynecology | Admitting: Obstetrics & Gynecology

## 2018-06-02 ENCOUNTER — Encounter (HOSPITAL_COMMUNITY): Payer: Self-pay

## 2018-06-02 DIAGNOSIS — O209 Hemorrhage in early pregnancy, unspecified: Secondary | ICD-10-CM

## 2018-06-02 DIAGNOSIS — Z3A12 12 weeks gestation of pregnancy: Secondary | ICD-10-CM | POA: Insufficient documentation

## 2018-06-02 DIAGNOSIS — O021 Missed abortion: Secondary | ICD-10-CM | POA: Insufficient documentation

## 2018-06-02 DIAGNOSIS — F329 Major depressive disorder, single episode, unspecified: Secondary | ICD-10-CM | POA: Insufficient documentation

## 2018-06-02 DIAGNOSIS — O99341 Other mental disorders complicating pregnancy, first trimester: Secondary | ICD-10-CM | POA: Insufficient documentation

## 2018-06-02 DIAGNOSIS — Z888 Allergy status to other drugs, medicaments and biological substances status: Secondary | ICD-10-CM | POA: Insufficient documentation

## 2018-06-02 DIAGNOSIS — Z79899 Other long term (current) drug therapy: Secondary | ICD-10-CM | POA: Insufficient documentation

## 2018-06-02 LAB — URINALYSIS, ROUTINE W REFLEX MICROSCOPIC
BACTERIA UA: NONE SEEN
BILIRUBIN URINE: NEGATIVE
Glucose, UA: NEGATIVE mg/dL
KETONES UR: NEGATIVE mg/dL
Leukocytes, UA: NEGATIVE
NITRITE: NEGATIVE
Protein, ur: NEGATIVE mg/dL
SPECIFIC GRAVITY, URINE: 1.02 (ref 1.005–1.030)
pH: 6 (ref 5.0–8.0)

## 2018-06-02 LAB — WET PREP, GENITAL
Clue Cells Wet Prep HPF POC: NONE SEEN
Sperm: NONE SEEN
Trich, Wet Prep: NONE SEEN
Yeast Wet Prep HPF POC: NONE SEEN

## 2018-06-02 LAB — POCT PREGNANCY, URINE: PREG TEST UR: POSITIVE — AB

## 2018-06-02 NOTE — MAU Note (Signed)
Pt states that she is 18 weeks.   Took a home pregnancy test that was positve, but has not started prenatal care.   Reports +FM   Friday pink spotting, but today bright red spotting.

## 2018-06-03 ENCOUNTER — Inpatient Hospital Stay (HOSPITAL_COMMUNITY)
Admission: AD | Admit: 2018-06-03 | Discharge: 2018-06-03 | Disposition: A | Discharge status: Home / Self Care | Payer: Self-pay | Source: Ambulatory Visit | Attending: Obstetrics & Gynecology | Admitting: Obstetrics & Gynecology

## 2018-06-03 ENCOUNTER — Encounter (HOSPITAL_COMMUNITY): Payer: Self-pay | Admitting: Anesthesiology

## 2018-06-03 ENCOUNTER — Ambulatory Visit: Admit: 2018-06-03 | Payer: Self-pay | Admitting: Obstetrics & Gynecology

## 2018-06-03 ENCOUNTER — Encounter (HOSPITAL_COMMUNITY): Payer: Self-pay | Admitting: Advanced Practice Midwife

## 2018-06-03 DIAGNOSIS — E119 Type 2 diabetes mellitus without complications: Secondary | ICD-10-CM | POA: Insufficient documentation

## 2018-06-03 DIAGNOSIS — O021 Missed abortion: Secondary | ICD-10-CM

## 2018-06-03 DIAGNOSIS — I10 Essential (primary) hypertension: Secondary | ICD-10-CM | POA: Insufficient documentation

## 2018-06-03 DIAGNOSIS — O039 Complete or unspecified spontaneous abortion without complication: Secondary | ICD-10-CM

## 2018-06-03 DIAGNOSIS — Z881 Allergy status to other antibiotic agents status: Secondary | ICD-10-CM | POA: Insufficient documentation

## 2018-06-03 DIAGNOSIS — Z6791 Unspecified blood type, Rh negative: Secondary | ICD-10-CM | POA: Insufficient documentation

## 2018-06-03 DIAGNOSIS — Z79899 Other long term (current) drug therapy: Secondary | ICD-10-CM | POA: Insufficient documentation

## 2018-06-03 DIAGNOSIS — E039 Hypothyroidism, unspecified: Secondary | ICD-10-CM | POA: Insufficient documentation

## 2018-06-03 DIAGNOSIS — Z3A12 12 weeks gestation of pregnancy: Secondary | ICD-10-CM | POA: Insufficient documentation

## 2018-06-03 DIAGNOSIS — F329 Major depressive disorder, single episode, unspecified: Secondary | ICD-10-CM | POA: Insufficient documentation

## 2018-06-03 DIAGNOSIS — I87309 Chronic venous hypertension (idiopathic) without complications of unspecified lower extremity: Secondary | ICD-10-CM | POA: Insufficient documentation

## 2018-06-03 DIAGNOSIS — Z885 Allergy status to narcotic agent status: Secondary | ICD-10-CM | POA: Insufficient documentation

## 2018-06-03 LAB — CBC
HCT: 32.5 % — ABNORMAL LOW (ref 36.0–46.0)
HEMATOCRIT: 33.8 % — AB (ref 36.0–46.0)
Hemoglobin: 10.3 g/dL — ABNORMAL LOW (ref 12.0–15.0)
Hemoglobin: 10.3 g/dL — ABNORMAL LOW (ref 12.0–15.0)
MCH: 24.4 pg — AB (ref 26.0–34.0)
MCH: 24.9 pg — AB (ref 26.0–34.0)
MCHC: 30.5 g/dL (ref 30.0–36.0)
MCHC: 31.7 g/dL (ref 30.0–36.0)
MCV: 78.5 fL — AB (ref 80.0–100.0)
MCV: 80.1 fL (ref 80.0–100.0)
PLATELETS: 257 10*3/uL (ref 150–400)
Platelets: 242 10*3/uL (ref 150–400)
RBC: 4.14 MIL/uL (ref 3.87–5.11)
RBC: 4.22 MIL/uL (ref 3.87–5.11)
RDW: 15.8 % — AB (ref 11.5–15.5)
RDW: 16.1 % — AB (ref 11.5–15.5)
WBC: 10.4 10*3/uL (ref 4.0–10.5)
WBC: 15.2 10*3/uL — ABNORMAL HIGH (ref 4.0–10.5)
nRBC: 0 % (ref 0.0–0.2)

## 2018-06-03 LAB — GC/CHLAMYDIA PROBE AMP (~~LOC~~) NOT AT ARMC
Chlamydia: NEGATIVE
Neisseria Gonorrhea: NEGATIVE

## 2018-06-03 LAB — TYPE AND SCREEN
ABO/RH(D): A NEG
ANTIBODY SCREEN: NEGATIVE

## 2018-06-03 SURGERY — DILATION AND EVACUATION, UTERUS
Anesthesia: Choice

## 2018-06-03 MED ORDER — IBUPROFEN 600 MG PO TABS: 600.0000 mg | ORAL_TABLET | Freq: Four times a day (QID) | ORAL | 0 refills | Status: AC | PRN

## 2018-06-03 MED ORDER — LACTATED RINGERS IV SOLN
INTRAVENOUS | Status: DC
  Administered 2018-06-03 (×2): via INTRAVENOUS

## 2018-06-03 MED ORDER — HYDROXYZINE HCL 25 MG PO TABS
25.0000 mg | ORAL_TABLET | Freq: Once | ORAL | Status: AC
  Administered 2018-06-03: 25 mg via ORAL
  Filled 2018-06-03: qty 1

## 2018-06-03 MED ORDER — RHO D IMMUNE GLOBULIN 1500 UNIT/2ML IJ SOSY
300.0000 ug | PREFILLED_SYRINGE | Freq: Once | INTRAMUSCULAR | Status: AC
  Administered 2018-06-03: 300 ug via INTRAMUSCULAR
  Filled 2018-06-03: qty 2

## 2018-06-03 MED ORDER — MISOPROSTOL 200 MCG PO TABS
400.0000 ug | ORAL_TABLET | Freq: Once | ORAL | Status: AC
  Administered 2018-06-03: 400 ug via BUCCAL
  Filled 2018-06-03: qty 2

## 2018-06-03 MED ORDER — HYDROMORPHONE HCL 1 MG/ML IJ SOLN
1.0000 mg | Freq: Once | INTRAMUSCULAR | Status: AC
  Administered 2018-06-03: 1 mg via INTRAVENOUS
  Filled 2018-06-03 (×2): qty 1

## 2018-06-03 MED ORDER — KETOROLAC TROMETHAMINE 60 MG/2ML IM SOLN
60.0000 mg | Freq: Once | INTRAMUSCULAR | Status: AC
  Administered 2018-06-03: 60 mg via INTRAMUSCULAR
  Filled 2018-06-03: qty 2

## 2018-06-03 MED ORDER — MISOPROSTOL 200 MCG PO TABS
400.0000 ug | ORAL_TABLET | Freq: Once | ORAL | Status: AC
  Administered 2018-06-03: 400 ug via VAGINAL
  Filled 2018-06-03: qty 2

## 2018-06-03 NOTE — Discharge Instructions (Signed)
Expect the clinic to call you by Tuesday to schedule an appointment.  If not, given them a call. No sex until you have been seen for your appointment in the clinic.

## 2018-06-03 NOTE — MAU Note (Signed)
Katie, Chaplain in to see patient.

## 2018-06-03 NOTE — MAU Provider Note (Signed)
Chief Complaint: Vaginal Bleeding   First Provider Initiated Contact with Patient 06/02/18 2317       SUBJECTIVE HPI: Cynthia Foster is a 32 y.o. [id number] at 11w1dby sure LMP who presents to maternity admissions reporting light vaginal bleeding.  Started Friday as spotting.  States LMP is sure, never misses cycles.  Thinks she has been feeling movement. Has not had any care this pregnancy yet.   Working a lot. . She denies vaginal itching/burning, urinary symptoms, h/a, dizziness, n/v, or fever/chills.    Vaginal Bleeding  The patient's primary symptoms include vaginal bleeding. The patient's pertinent negatives include no genital itching, genital lesions, genital odor or pelvic pain. This is a new problem. The current episode started in the past 7 days. The problem occurs intermittently. The problem has been unchanged. She is pregnant. Pertinent negatives include no chills, constipation, diarrhea, fever, nausea or vomiting. The vaginal discharge was bloody. The vaginal bleeding is spotting. She has not been passing clots. She has not been passing tissue. Nothing aggravates the symptoms. She has tried nothing for the symptoms.   RN note: Pt states that she is 18 weeks.  Took a home pregnancy test that was positve, but has not started prenatal care.  Reports +FM  Friday pink spotting, but today bright red spotting.    Past Medical History:  Diagnosis Date  . Depression   . Diabetes mellitus without complication ([facility]   . Hypertension   . Hypothyroidism    Past Surgical History:  Procedure Laterality Date  . WISDOM TOOTH EXTRACTION     Social History   Socioeconomic History  . Marital status: Legally Separated    Spouse name: Not on file  . Number of children: Not on file  . Years of education: Not on file  . Highest education level: Not on file  Occupational History  . Not on file  Social Needs  . Financial resource strain: Not on file  . Food insecurity:    Worry: Not  on file    Inability: Not on file  . Transportation needs:    Medical: Not on file    Non-medical: Not on file  Tobacco Use  . Smoking status: Never Smoker  . Smokeless tobacco: Never Used  Substance and Sexual Activity  . Alcohol use: No  . Drug use: No  . Sexual activity: Yes    Birth control/protection: None  Lifestyle  . Physical activity:    Days per week: Not on file    Minutes per session: Not on file  . Stress: Not on file  Relationships  . Social connections:    Talks on phone: Not on file    Gets together: Not on file    Attends religious service: Not on file    Active member of club or organization: Not on file    Attends meetings of clubs or organizations: Not on file    Relationship status: Not on file  . Intimate partner violence:    Fear of current or ex partner: Not on file    Emotionally abused: Not on file    Physically abused: Not on file    Forced sexual activity: Not on file  Other Topics Concern  . Not on file  Social History Narrative  . Not on file   No current facility-administered medications on file prior to encounter.    Current Outpatient Medications on File Prior to Encounter  Medication Sig Dispense Refill  . acetaminophen (TYLENOL) 325 MG  tablet Take 650 mg by mouth every 6 (six) hours as needed for moderate pain.    Marland Kitchen ibuprofen (ADVIL,MOTRIN) 600 MG tablet Take 1 tablet (600 mg total) by mouth every 6 (six) hours as needed. 30 tablet 2  . oxyCODONE-acetaminophen (PERCOCET/ROXICET) 5-325 MG per tablet Take 1 tablet by mouth every 4 (four) hours as needed (for pain scale 4-7). 30 tablet 0  . pantoprazole (PROTONIX) 40 MG tablet Take 1 tablet (40 mg total) by mouth daily. 30 tablet 2  . PARoxetine (PAXIL) 40 MG tablet Take 1 tablet (40 mg total) by mouth daily. 30 tablet 2   Allergies  Allergen Reactions  . Sertraline Hcl Shortness Of Breath    No rxn to any other SSRI.  Marland Kitchen Cocoa Butter Rash    I have reviewed patient's Past Medical  Hx, Surgical Hx, Family Hx, Social Hx, medications and allergies.   ROS:  Review of Systems  Constitutional: Negative for chills and fever.  Gastrointestinal: Negative for constipation, diarrhea, nausea and vomiting.  Genitourinary: Positive for vaginal bleeding. Negative for pelvic pain.   Review of Systems  Other systems negative   Physical Exam  Physical Exam Patient Vitals for the past 24 hrs:  BP Temp Temp src Pulse Resp SpO2 Height Weight  06/02/18 2315 (!) 132/95 - - 98 - - - -  06/02/18 2313 (!) 157/102 - - (!) 110 - - - -  06/02/18 2312 (!) 157/102 98.5 F (36.9 C) Oral - 17 100 % - -  06/02/18 2303 - - - - - - '5\' 2"'  (1.575 m) 100.7 kg   Constitutional: Well-developed, well-nourished female in no acute distress.  Cardiovascular: normal rate Respiratory: normal effort GI: Abd soft, non-tender. Pos BS x 4 MS: Extremities nontender, no edema, normal ROM Neurologic: Alert and oriented x 4.  GU: Neg CVAT.  PELVIC EXAM: Cervix pink, visually closed, without lesion, scant light red discharge, vaginal walls and external genitalia normal Bimanual exam: Cervix 0/long/high, firm, anterior, neg CMT, uterus nontender, enlarged to 12 week size, adnexa without tenderness, enlargement, or mass  FHT not present  LAB RESULTS Results for orders placed or performed during the hospital encounter of 06/02/18 (from the past 24 hour(s))  Urinalysis, Routine w reflex microscopic     Status: Abnormal   Collection Time: 06/02/18 10:56 PM  Result Value Ref Range   Color, Urine YELLOW YELLOW   APPearance CLEAR CLEAR   Specific Gravity, Urine 1.020 1.005 - 1.030   pH 6.0 5.0 - 8.0   Glucose, UA NEGATIVE NEGATIVE mg/dL   Hgb urine dipstick SMALL (A) NEGATIVE   Bilirubin Urine NEGATIVE NEGATIVE   Ketones, ur NEGATIVE NEGATIVE mg/dL   Protein, ur NEGATIVE NEGATIVE mg/dL   Nitrite NEGATIVE NEGATIVE   Leukocytes, UA NEGATIVE NEGATIVE   RBC / HPF 0-5 0 - 5 RBC/hpf   WBC, UA 0-5 0 - 5 WBC/hpf    Bacteria, UA NONE SEEN NONE SEEN   Squamous Epithelial / LPF 0-5 0 - 5  Pregnancy, urine POC     Status: Abnormal   Collection Time: 06/02/18 11:06 PM  Result Value Ref Range   Preg Test, Ur POSITIVE (A) NEGATIVE  Wet prep, genital     Status: Abnormal   Collection Time: 06/02/18 11:24 PM  Result Value Ref Range   Yeast Wet Prep HPF POC NONE SEEN NONE SEEN   Trich, Wet Prep NONE SEEN NONE SEEN   Clue Cells Wet Prep HPF POC NONE SEEN NONE SEEN  WBC, Wet Prep HPF POC FEW (A) NONE SEEN   Sperm NONE SEEN    CBC and Type/Screen drawn RhIg Workup ordered    IMAGING Fetal demise 12 weeks Cervix open  MAU Management/MDM:  Pelvic exam and cultures done Will check baseline Ultrasound  Cultures were done to rule out pelvic infection Blood drawn for CBC, ABO/Rh, Rhogam workup   Consult Dr Ihor Dow with presentation, exam findings, and results.   Treatments in MAU included ultrasound, Rhophylac.   This bleeding/pain can represent a normal pregnancy with bleeding, spontaneous abortion or even an ectopic which can be life-threatening.  The process as listed above helps to determine which of these is present.  Discussed US findings, recommend D&E in am Patient grieving Will give RhIg Will draw preop labs now Wants something for anxiety. Vistaril given (midnight)  ASSESSMENT 1. Bleeding in early pregnancy   2. Bleeding in early pregnancy   3.     Missed abortion at 60w1dby LMP/12 weeks by CRL 4.    Rh Negative  PLAN Discharge home Rhophylac tonight Return at 0830 for preop for 10am surgery Miscarriage kit given  Pt stable at time of discharge. Encouraged to return here or to other Urgent Care/ED if she develops worsening of symptoms, increase in pain, fever, or other concerning symptoms.    MHansel FeinsteinCNM, MSN Certified Nurse-Midwife 06/03/2018  12:21 AM

## 2018-06-03 NOTE — Progress Notes (Signed)
I offered grief and emotional support over a period of > 2 hours.  Patient experienced trauma with an unexpected delivery at home after learning earlier in the evening  In MAU that her baby was no longer living.   Given the amount of blood present, she worried that she was bleeding out.  Her children were present with her and she is concerned that they were traumatized as well.  I offered support as well as resources for ongoing support through our Comfort program, Hospice and [employer] Pregnancy loss support.  We also discussed mental health support and counseling, particularly with her history of anxiety and the trauma she experienced.    We talked about her limited support and how she was going to get her needs met since FOB was not supportive when she needed him.    We offered some memory keepsakes as well for herself and her older 3 children (ages, 63, 35 and 3).  She had planned to name her baby Demetra Shiner (not sure of spelling) Lanny Hurst (after her father who died 65 years ago).  She plans to have her baby cremated and plans to contact Shirley Friar for that.  [place], [place] Pager, [phone] 1:01 PM    06/03/18 1200  Clinical Encounter Type  Visited With Patient  Visit Type Spiritual support  Referral From Nurse  Spiritual Encounters  Spiritual Needs Emotional;Grief support  Stress Factors  Patient Stress Factors Loss

## 2018-06-03 NOTE — Discharge Instructions (Signed)
Dilation and Curettage or Vacuum Curettage Dilation and curettage (D&C) and vacuum curettage are minor procedures. A D&C involves stretching (dilation) the cervix and scraping (curettage) the inside lining of the uterus (endometrium). During a D&C, tissue is gently scraped from the endometrium, starting from the top portion of the uterus down to the lowest part of the uterus (cervix). During a vacuum curettage, the lining and tissue in the uterus are removed with the use of gentle suction. Curettage may be performed to either diagnose or treat a problem. As a diagnostic procedure, curettage is performed to examine tissues from the uterus. A diagnostic curettage may be done if you have:  Irregular bleeding in the uterus.  Bleeding with the development of clots.  Spotting between menstrual periods.  Prolonged menstrual periods or other abnormal bleeding.  Bleeding after menopause.  No menstrual period (amenorrhea).  A change in size and shape of the uterus.  Abnormal endometrial cells discovered during a Pap test.  As a treatment procedure, curettage may be performed for the following reasons:  Removal of an IUD (intrauterine device).  Removal of retained placenta after giving birth.  Abortion.  Miscarriage.  Removal of endometrial polyps.  Removal of uncommon types of noncancerous lumps (fibroids).  Tell a health care provider about:  Any allergies you have, including allergies to prescribed medicine or latex.  All medicines you are taking, including vitamins, herbs, eye drops, creams, and over-the-counter medicines. This is especially important if you take any blood-thinning medicine. Bring a list of all of your medicines to your appointment.  Any problems you or family members have had with anesthetic medicines.  Any blood disorders you have.  Any surgeries you have had.  Your medical history and any medical conditions you have.  Whether you are pregnant or may be  pregnant.  Recent vaginal infections you have had.  Recent menstrual periods, bleeding problems you have had, and what form of birth control (contraception) you use. What are the risks? Generally, this is a safe procedure. However, problems may occur, including:  Infection.  Heavy vaginal bleeding.  Allergic reactions to medicines.  Damage to the cervix or other structures or organs.  Development of scar tissue (adhesions) inside the uterus, which can cause abnormal amounts of menstrual bleeding. This may make it harder to get pregnant in the future.  A hole (perforation) or puncture in the uterine wall. This is rare.  What happens before the procedure? Staying hydrated Follow instructions from your health care provider about hydration, which may include:  Up to 2 hours before the procedure - you may continue to drink clear liquids, such as water, clear fruit juice, black coffee, and plain tea.  Eating and drinking restrictions Follow instructions from your health care provider about eating and drinking, which may include:  8 hours before the procedure - stop eating heavy meals or foods such as meat, fried foods, or fatty foods.  6 hours before the procedure - stop eating light meals or foods, such as toast or cereal.  6 hours before the procedure - stop drinking milk or drinks that contain milk.  2 hours before the procedure - stop drinking clear liquids. If your health care provider told you to take your medicine(s) on the day of your procedure, take them with only a sip of water.  Medicines  Ask your health care provider about: ? Changing or stopping your regular medicines. This is especially important if you are taking diabetes medicines or blood thinners. ? Taking   medicines such as aspirin and ibuprofen. These medicines can thin your blood. Do not take these medicines before your procedure if your health care provider instructs you not to.  You may be given antibiotic  medicine to help prevent infection. General instructions  For 24 hours before your procedure, do not: ? Douche. ? Use tampons. ? Use medicines, creams, or suppositories in the vagina. ? Have sexual intercourse.  You may be given a pregnancy test on the day of the procedure.  Plan to have someone take you home from the hospital or clinic.  You may have a blood or urine sample taken.  If you will be going home right after the procedure, plan to have someone with you for 24 hours. What happens during the procedure?  To reduce your risk of infection: ? Your health care team will wash or sanitize their hands. ? Your skin will be washed with soap.  An IV tube will be inserted into one of your veins.  You will be given one of the following: ? A medicine that numbs the area in and around the cervix (local anesthetic). ? A medicine to make you fall asleep (general anesthetic).  You will lie down on your back, with your feet in foot rests (stirrups).  The size and position of your uterus will be checked.  A lubricated instrument (speculum or Sims retractor) will be inserted into the back side of your vagina. The speculum will be used to hold apart the walls of your vagina so your health care provider can see your cervix.  A tool (tenaculum) will be attached to the lip of the cervix to stabilize it.  Your cervix will be softened and dilated. This may be done by: ? Taking a medicine. ? Having tapered dilators or thin rods (laminaria) or gradual widening instruments (tapered dilators) inserted into your cervix.  A small, sharp, curved instrument (curette) will be used to scrape a small amount of tissue or cells from the endometrium or cervical canal. In some cases, gentle suction is applied with the curette. The curette will then be removed. The cells will be taken to a lab for testing. The procedure may vary among health care providers and hospitals. What happens after the  procedure?  You may have mild cramping, backache, pain, and light bleeding or spotting. You may pass small blood clots from your vagina.  You may have to wear compression stockings. These stockings help to prevent blood clots and reduce swelling in your legs.  Your blood pressure, heart rate, breathing rate, and blood oxygen level will be monitored until the medicines you were given have worn off. Summary  Dilation and curettage (D&C) involves stretching (dilation) the cervix and scraping (curettage) the inside lining of the uterus (endometrium).  After the procedure, you may have mild cramping, backache, pain, and light bleeding or spotting. You may pass small blood clots from your vagina.  Plan to have someone take you home from the hospital or clinic. This information is not intended to replace advice given to you by your health care provider. Make sure you discuss any questions you have with your health care provider. Document Released: 07/20/2005 Document Revised: 04/05/2016 Document Reviewed: 04/05/2016 Elsevier Interactive Patient Education  2018 [employer]. Dilation and Curettage or Vacuum Curettage, Care After These instructions give you information about caring for yourself after your procedure. Your doctor may also give you more specific instructions. Call your doctor if you have any problems or questions after your  procedure. Follow these instructions at home: Activity  Do not drive or use heavy machinery while taking prescription pain medicine.  For 24 hours after your procedure, avoid driving.  Take short walks often, followed by rest periods. Ask your doctor what activities are safe for you. After one or two days, you may be able to return to your normal activities.  Do not lift anything that is heavier than 10 lb (4.5 kg) until your doctor approves.  For at least 2 weeks, or as long as told by your doctor: ? Do not douche. ? Do not use tampons. ? Do not have  sex. General instructions  Take over-the-counter and prescription medicines only as told by your doctor. This is very important if you take blood thinning medicine.  Do not take baths, swim, or use a hot tub until your doctor approves. Take showers instead of baths.  Wear compression stockings as told by your doctor.  It is up to you to get the results of your procedure. Ask your doctor when your results will be ready.  Keep all follow-up visits as told by your doctor. This is important. Contact a doctor if:  You have very bad cramps that get worse or do not get better with medicine.  You have very bad pain in your belly (abdomen).  You cannot drink fluids without throwing up (vomiting).  You get pain in a different part of the area between your belly and thighs (pelvis).  You have bad-smelling discharge from your vagina.  You have a rash. Get help right away if:  You are bleeding a lot from your vagina. A lot of bleeding means soaking more than one sanitary pad in an hour, for 2 hours in a row.  You have clumps of blood (blood clots) coming from your vagina.  You have a fever or chills.  Your belly feels very tender or hard.  You have chest pain.  You have trouble breathing.  You cough up blood.  You feel dizzy.  You feel light-headed.  You pass out (faint).  You have pain in your neck or shoulder area. Summary  Take short walks often, followed by rest periods. Ask your doctor what activities are safe for you. After one or two days, you may be able to return to your normal activities.  Do not lift anything that is heavier than 10 lb (4.5 kg) until your doctor approves.  Do not take baths, swim, or use a hot tub until your doctor approves. Take showers instead of baths.  Contact your doctor if you have any symptoms of infection, like bad-smelling discharge from your vagina. This information is not intended to replace advice given to you by your health care  provider. Make sure you discuss any questions you have with your health care provider. Document Released: 04/28/2008 Document Revised: 04/06/2016 Document Reviewed: 04/06/2016 Elsevier Interactive Patient Education  2017 [employer].

## 2018-06-03 NOTE — MAU Note (Signed)
Pt assisted up to BR

## 2018-06-03 NOTE — MAU Provider Note (Addendum)
Chief Complaint:  No chief complaint on file.   First Provider Initiated Contact with Patient 06/03/18 [phone]     HPI: Cynthia Foster is a 32 y.o. [phone] who presents to maternity admissions reporting delivery of fetus at home.  States got up to bathroom and felt a gush of clear fluid come out.  A short while later, felt the fetus come out and hit the floor.  After that she started cramping and bleeding heavily.  Never had much cramping before the PPROM and passage of fetus.  Called EMS due to the bleeding. . She reports vaginal bleeding, but no vaginal itching/burning, urinary symptoms, h/a, dizziness, n/v, or fever/chills.    Was seen earlier for diagnosis of fetal demise/missed abortion Was scheduled for D&C this morning at 10.  Past Medical History: Past Medical History:  Diagnosis Date  . Depression   . Diabetes mellitus without complication (HCC)   . Hypertension   . Hypothyroidism     Past obstetric history: OB History  Gravida Para Term Preterm AB Living  4 3 3  0   3  SAB TAB Ectopic Multiple Live Births        0 3    # Outcome Date GA Lbr Len/2nd Weight Sex Delivery Anes PTL Lv  4 Current           3 Term 10/22/14 [redacted]w[redacted]d 17:45 / 01:12 4140 g M Vag-Spont EPI  LIV  2 Term 2010     Vag-Spont   LIV  1 Term 2009    F Vag-Spont   LIV    Past Surgical History: Past Surgical History:  Procedure Laterality Date  . WISDOM TOOTH EXTRACTION      Family History: No family history on file.  Social History: Social History   Tobacco Use  . Smoking status: Never Smoker  . Smokeless tobacco: Never Used  Substance Use Topics  . Alcohol use: No  . Drug use: No    Allergies:  Allergies  Allergen Reactions  . Sertraline Shortness Of Breath  . Sertraline Hcl Shortness Of Breath    No rxn to any other SSRI.  Marland Kitchen Sertraline Hcl Shortness Of Breath  . Cocoa Butter Rash  . Azithromycin Swelling and Itching  . Codeine   . Hydrocodone-Homatropine Itching  . Labetalol      Drunk    Meds:  Medications Prior to Admission  Medication Sig Dispense Refill Last Dose  . acetaminophen (TYLENOL) 325 MG tablet Take 650 mg by mouth every 6 (six) hours as needed for moderate pain.   prn at Unknown time  . glucose blood test strip Accu-Chek SmartView Test Strips     . ibuprofen (ADVIL,MOTRIN) 600 MG tablet Take 1 tablet (600 mg total) by mouth every 6 (six) hours as needed. 30 tablet 2   . oxyCODONE-acetaminophen (PERCOCET/ROXICET) 5-325 MG per tablet Take 1 tablet by mouth every 4 (four) hours as needed (for pain scale 4-7). 30 tablet 0   . pantoprazole (PROTONIX) 40 MG tablet Take 1 tablet (40 mg total) by mouth daily. 30 tablet 2 10/21/2014 at Unknown time  . PARoxetine (PAXIL) 40 MG tablet Take 1 tablet (40 mg total) by mouth daily. 30 tablet 2     I have reviewed patient's Past Medical Hx, Surgical Hx, Family Hx, Social Hx, medications and allergies.  ROS:  Review of Systems  Constitutional: Negative for chills and fever.  Respiratory: Negative for shortness of breath.   Genitourinary: Positive for pelvic pain and vaginal  bleeding.  Neurological: Negative for dizziness.   Other systems negative   Physical Exam  No data found. Constitutional: Well-developed, well-nourished female in no acute distress.  Cardiovascular: normal rate and rhythm Respiratory: normal effort, no distress.  GI: Abd soft, non-tender.  Nondistended.  No rebound, No guarding.   MS: Extremities nontender, no edema, normal ROM Neurologic: Alert and oriented x 4.   Grossly nonfocal. GU: Neg CVAT. Skin:  Warm and Dry Psych:  Affect appropriate.  PELVIC EXAM: Fetus was in red bag from EMS.  Very thin cord attached to fetus which appears to be about 12 weeks.  Cord had avulsed from placenta Sterile speculum inserted.  Copious clots evacuated, probably about 150cc Placental tissue felt at cervical os which was open about 1-2cm I attempted removal with ring forceps and manually.  Feels  partially through cervix but unable to extract it.    Labs: --/--/A NEG, A NEG (11/01 0029) Results for orders placed or performed during the hospital encounter of 06/03/18 (from the past 24 hour(s))  CBC     Status: Abnormal   Collection Time: 06/03/18  6:36 AM  Result Value Ref Range   WBC 15.2 (H) 4.0 - 10.5 K/uL   RBC 4.22 3.87 - 5.11 MIL/uL   Hemoglobin 10.3 (L) 12.0 - 15.0 g/dL   HCT 16.1 (L) 09.6 - 04.5 %   MCV 80.1 80.0 - 100.0 fL   MCH 24.4 (L) 26.0 - 34.0 pg   MCHC 30.5 30.0 - 36.0 g/dL   RDW 40.9 (H) 81.1 - 91.4 %   Platelets 257 150 - 400 K/uL   nRBC 0.0 0.0 - 0.2 %    Ref. Range 06/03/2018 00:29  Hemoglobin Latest Ref Range: 12.0 - 15.0 g/dL 78.2 (L)  HCT Latest Ref Range: 36.0 - 46.0 % 32.5 (L)   Imaging:  US Ob Comp Less 14 Wks  Result Date: 06/03/2018 CLINICAL DATA:  Initial evaluation for acute vaginal bleeding, early pregnancy. EXAM: OBSTETRIC <14 WK [place] AND TRANSVAGINAL OB US TECHNIQUE: Both transabdominal and transvaginal ultrasound examinations were performed for complete evaluation of the gestation as well as the maternal uterus, adnexal regions, and pelvic cul-de-sac. Transvaginal technique was performed to assess early pregnancy. COMPARISON:  None. FINDINGS: Intrauterine gestational sac: Single Yolk sac:  Not visualized. Embryo:  Present Cardiac Activity: Negative. Heart Rate: Negative.  bpm CRL:  Approximate 54 mm. BPD = 1.69 cm Head circumference = 6.91 cm Femoral length = 0.8 cm Estimated gestational age [redacted] weeks and 3 days. Subchorionic hemorrhage:  None visualized. Maternal uterus/adnexae: Right ovary not seen. Left ovary normal. No free fluid within the pelvis. Prominent placenta seen posteriorly with heterogeneous echotexture. Cervical canal distended to 10 mm to the level of the external os. IMPRESSION: 1. Single intrauterine pregnancy, estimated gestational age [redacted] weeks and 3 days, with no discernible cardiac activity. Findings meet definitive criteria for  failed pregnancy. This follows SRU consensus guidelines: Diagnostic Criteria for Nonviable Pregnancy Early in the First Trimester. Macy Mis J Med [phone]. 2. Cervical canal dilated to 10 mm to the level of the external os. Electronically Signed   By: Rise Mu M.D.   On: 06/03/2018 00:52   US Ob Transvaginal  Result Date: 06/03/2018 CLINICAL DATA:  Initial evaluation for acute vaginal bleeding, early pregnancy. EXAM: OBSTETRIC <14 WK [place] AND TRANSVAGINAL OB US TECHNIQUE: Both transabdominal and transvaginal ultrasound examinations were performed for complete evaluation of the gestation as well as the maternal uterus, adnexal regions, and pelvic  cul-de-sac. Transvaginal technique was performed to assess early pregnancy. COMPARISON:  None. FINDINGS: Intrauterine gestational sac: Single Yolk sac:  Not visualized. Embryo:  Present Cardiac Activity: Negative. Heart Rate: Negative.  bpm CRL:  Approximate 54 mm. BPD = 1.69 cm Head circumference = 6.91 cm Femoral length = 0.8 cm Estimated gestational age [redacted] weeks and 3 days. Subchorionic hemorrhage:  None visualized. Maternal uterus/adnexae: Right ovary not seen. Left ovary normal. No free fluid within the pelvis. Prominent placenta seen posteriorly with heterogeneous echotexture. Cervical canal distended to 10 mm to the level of the external os. IMPRESSION: 1. Single intrauterine pregnancy, estimated gestational age [redacted] weeks and 3 days, with no discernible cardiac activity. Findings meet definitive criteria for failed pregnancy. This follows SRU consensus guidelines: Diagnostic Criteria for Nonviable Pregnancy Early in the First Trimester. Macy Mis J Med [phone]. 2. Cervical canal dilated to 10 mm to the level of the external os. Electronically Signed   By: Rise Mu M.D.   On: 06/03/2018 00:52    MAU Course/MDM: I have ordered labs as follows:  Repeat CBC > Hgb unchanged in 6 hrs  Fetus examined grossly and shown to patient per  request.  Appears to be female, grossly normal features for GA.  Thin cord attached to fetus but not to placenta.  Imaging ordered: none Results reviewed.   Consult Dr Erin Fulling who recommends Cytotec .   Treatments in MAU included IV fluids, Cytotec.   Cytotec given buccally but pt vomited afterward IV started and CBC sent Pt declined Dilaudid for now Arguing with husband on phone Will add cytotec vaginally  Shortly after above, around 0730, patient passed clots and full placenta Still has some clots at cervical os , so will go ahead with the Cytotec vaginally to help finish evacuation of clots.   Will likely be able to discharge home after observing for a while for bleeding.   Assessment: Single intrauterine pregnancy at [redacted]w[redacted]d by sure LMP, with demise of fetus at 12 weeks Delivered fetus at home   Delivered placenta here.    Plan: Observe for 30-60 minutes for bleeding, then discharge home Will schedule outpatient followup at clinic Assumed care from [facility] at [phone] Chaplain in to see client and spoke at length Client remained in MAU and got Toradol for pain.  Has Ibuprofen at home.  Working with Chaplain to have baby cremated.  To expect call from clinic to schedule her for an appointment.   By the time of discharge pain was controlled, BP stable, client able to walk unassisted to the bathroom.  Doing well.   Nolene Bernheim, RN, MSN, NP-BC Nurse Practitioner, [facility] for [employer], [facility] 06/03/2018 10:39 AM    Wynelle Bourgeois CNM, MSN Certified Nurse-Midwife 06/03/2018 6:54 AM

## 2018-06-04 LAB — RH IG WORKUP (INCLUDES ABO/RH)
ABO/RH(D): A NEG
ANTIBODY SCREEN: NEGATIVE
Gestational Age(Wks): 12
UNIT DIVISION: 0

## 2018-06-27 ENCOUNTER — Encounter: Payer: Self-pay | Admitting: Student

## 2018-06-27 ENCOUNTER — Ambulatory Visit (INDEPENDENT_AMBULATORY_CARE_PROVIDER_SITE_OTHER): Payer: Self-pay | Admitting: Clinical

## 2018-06-27 ENCOUNTER — Ambulatory Visit (INDEPENDENT_AMBULATORY_CARE_PROVIDER_SITE_OTHER): Payer: Self-pay | Admitting: Student

## 2018-06-27 VITALS — BP 142/86 | HR 95

## 2018-06-27 DIAGNOSIS — F411 Generalized anxiety disorder: Secondary | ICD-10-CM

## 2018-06-27 DIAGNOSIS — F419 Anxiety disorder, unspecified: Secondary | ICD-10-CM

## 2018-06-27 DIAGNOSIS — F4321 Adjustment disorder with depressed mood: Secondary | ICD-10-CM

## 2018-06-27 DIAGNOSIS — F4310 Post-traumatic stress disorder, unspecified: Secondary | ICD-10-CM

## 2018-06-27 MED ORDER — PAROXETINE HCL 20 MG PO TABS: 20.0000 mg | ORAL_TABLET | Freq: Every day | ORAL | 2 refills | Status: AC

## 2018-06-27 MED ORDER — ALPRAZOLAM 1 MG PO TABS: 1.0000 mg | ORAL_TABLET | Freq: Every evening | ORAL | 1 refills | Status: DC | PRN

## 2018-06-27 MED ORDER — ZOLPIDEM TARTRATE 10 MG PO TABS: 10.0000 mg | ORAL_TABLET | Freq: Every evening | ORAL | 3 refills | Status: DC | PRN

## 2018-06-27 NOTE — Progress Notes (Signed)
Pt had recent SAB at home at 15-16wks. Very upset about loss. Spoke with Asher MuirJamie today before this appt.

## 2018-06-27 NOTE — BH Specialist Note (Signed)
Integrated Behavioral Health Initial Visit  MRN: [id number] Name: Cynthia Foster  Number of Integrated Behavioral Health Clinician visits:: 1/6 Session Start time: 4:15  Session End time: 4:35 Total time: 20 minutes  Type of Service: Integrated Behavioral Health- Individual/Family Interpretor:No. Interpretor Name and Language: n/a   Warm Hand Off Completed.       SUBJECTIVE: Cynthia Savageiffany N Jakel is a 32 y.o. female accompanied by n/a Patient was referred by Luna KitchensKathryn Kooistra, CNM for grief Patient reports the following symptoms/concerns: Pt states her primary concern today is overwhelming anxiety after the trauma of miscarrying at home, at 18 weeks, including nightmares, panic, and affecting her ability to function at home and work.  Pt has hx of anxiety, treated with medication prior to being uninsured.  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 Reduce symptoms of: anxiety and depression 2. Increase knowledge and/or ability of: healthy habits  3. Demonstrate ability to: Begin healthy grieving over loss  INTERVENTIONS: Interventions utilized: Supportive Counseling and Psychoeducation and/or Health Education  Standardized Assessments completed: GAD-7 and PHQ 9  ASSESSMENT: Patient currently experiencing Grief and Anxiety disorder, unspecified.   Patient may benefit from psychoeducation and brief therapeutic interventions regarding coping with symptoms of depression and anxiety with panic attacks and grief   PLAN: 1. Follow up with behavioral health clinician on : One week 2. Behavioral  recommendations:  -Begin taking medication, as prescribed by medical provider -Read educational materials regarding coping with symptoms of anxiety with panic attacks and depression -Consider establishing care at community mental health provider within two weeks  3. Referral(s): [facility] 4. "From scale of 1-10, how likely are you to follow plan?": 10  Rae LipsJamie C , LCSW  Depression screen [facility] 2/9 06/27/2018  Decreased Interest 3  Down, Depressed, Hopeless 3  PHQ - 2 Score 6  Altered sleeping 2  Tired, decreased energy 3  Change in appetite 0  Feeling bad or failure about yourself  2  Trouble concentrating 3  Moving slowly or fidgety/restless 1  Suicidal thoughts 0  PHQ-9 Score 17   GAD 7 : Generalized Anxiety Score 06/27/2018  Nervous, Anxious, on Edge 3  Control/stop worrying 3  Worry too much - different things 3  Trouble relaxing 3  Restless 3  Easily annoyed or irritable 3  Afraid - awful might happen 3  Total GAD 7 Score 21

## 2018-06-27 NOTE — Progress Notes (Signed)
  Subjective:     Patient ID: Cynthia Savageiffany N Foster, female   DOB: [birth year], 32 y.o.   MRN: [id number]  HPI  Patient is very upset; she is worried about holidays and dealing with grief over the loss of her mother, father and grandmother. She is extremely upset over the fetal demise; is having nightmares, cannot tolerate the sight of the bathroom where she had the baby. She saw Asher MuirJamie today.  She has a long history of anxiety; has taken Paxil, Xanax and Ambien and is requesting refills on these medications.   She is not sexually active; she has not had a period since then but she is cramping and thinks that her period is coming.  Review of Systems  Constitutional: Negative.   HENT: Negative.   Respiratory: Negative.   Cardiovascular: Negative.   Gastrointestinal: Negative.   Genitourinary: Negative.   Musculoskeletal: Negative.   Neurological: Negative.        Objective:   Physical Exam  Constitutional: She is oriented to person, place, and time. She appears well-developed.  HENT:  Head: Normocephalic.  Pulmonary/Chest: Effort normal.  Musculoskeletal: Normal range of motion.  Neurological: She is alert and oriented to person, place, and time.       Assessment:     1. Anxiety state   2. PTSD (post-traumatic stress disorder)        Plan:     -reassurance and support given -see Asher MuirJamie next week again to check in -pick up medications for anxiety and start taking right away. Reviewed anxiety medicines and how to take them.   -Patient grateful for our care; plans to return next week.   Luna KitchensKathryn Wang [place]

## 2018-06-27 NOTE — Patient Instructions (Signed)

## 2018-06-29 ENCOUNTER — Telehealth: Payer: Self-pay | Admitting: Clinical

## 2018-06-29 NOTE — Telephone Encounter (Signed)
Left HIPPA-compliant message to call back Jamie from [facility]  at [phone].  

## 2018-07-04 ENCOUNTER — Institutional Professional Consult (permissible substitution): Payer: Self-pay

## 2018-11-30 IMAGING — US US OB TRANSVAGINAL
1 series · 15 of 28 positions shown · non-contrast
Comparison: None.

CLINICAL DATA: Initial evaluation for acute vaginal bleeding, early
pregnancy.

EXAM:
OBSTETRIC <14 WK US AND TRANSVAGINAL OB US
TECHNIQUE: Both transabdominal and transvaginal ultrasound examinations were
performed for complete evaluation of the gestation as well as the
maternal uterus, adnexal regions, and pelvic cul-de-sac.
Transvaginal technique was performed to assess early pregnancy.

[Series 1: us ob transvaginal · 15 of 43 slices shown]
[im 1/43]
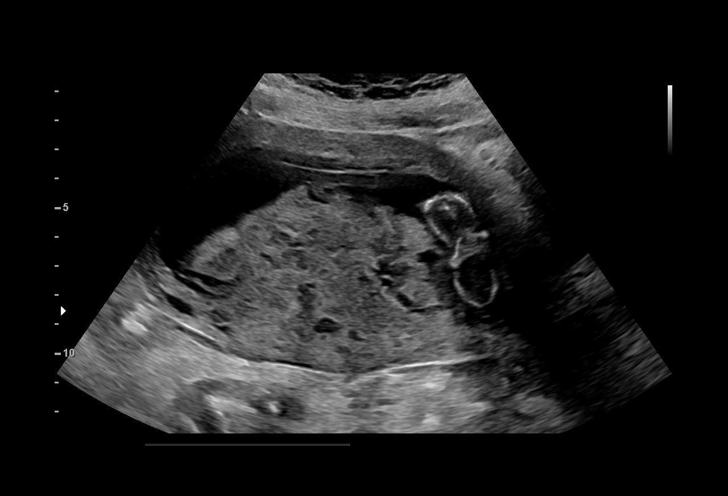
[im 4/43]
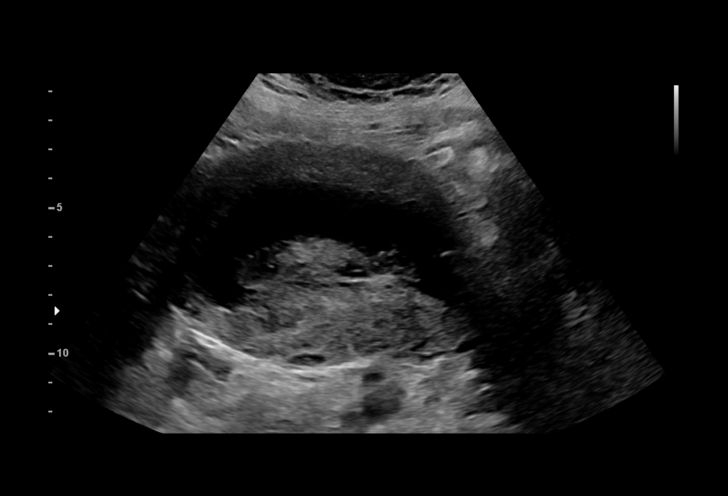
[im 7/43]
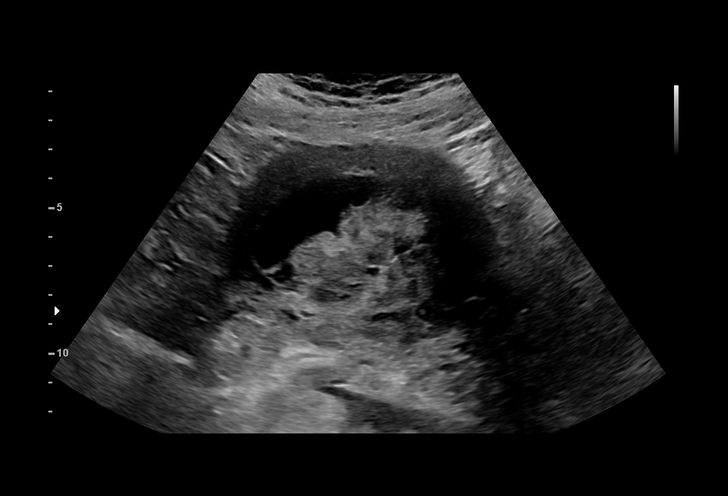
[im 10/43]
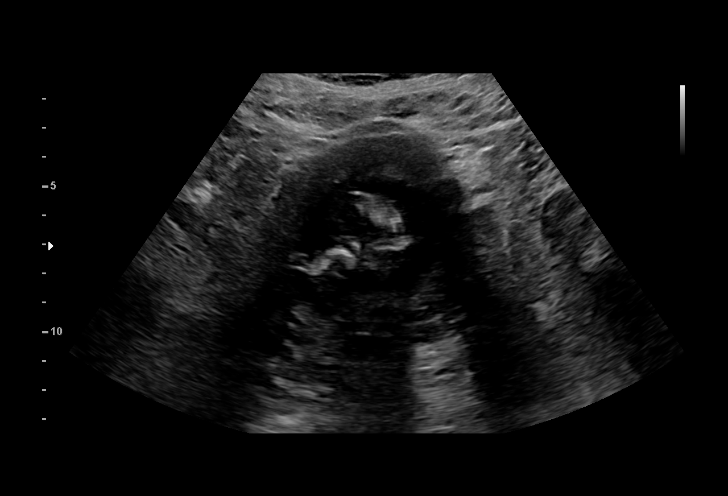
[im 13/43]
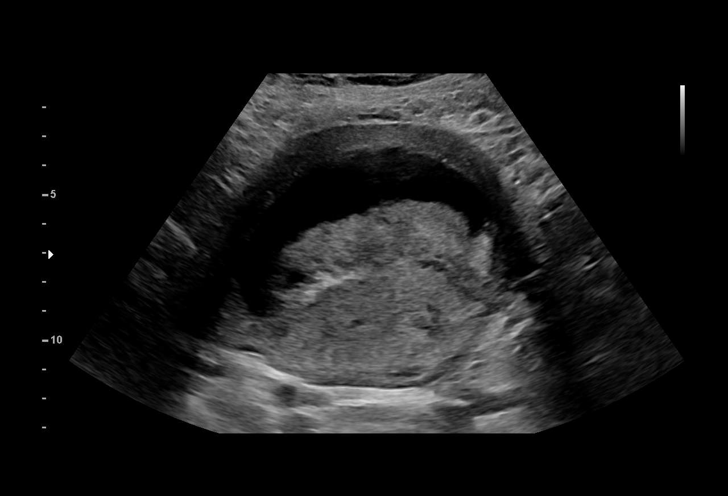
[im 16/43]
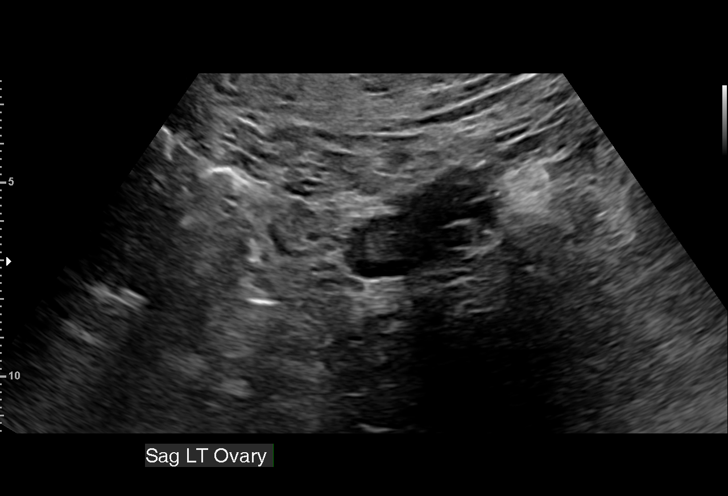
[im 19/43]
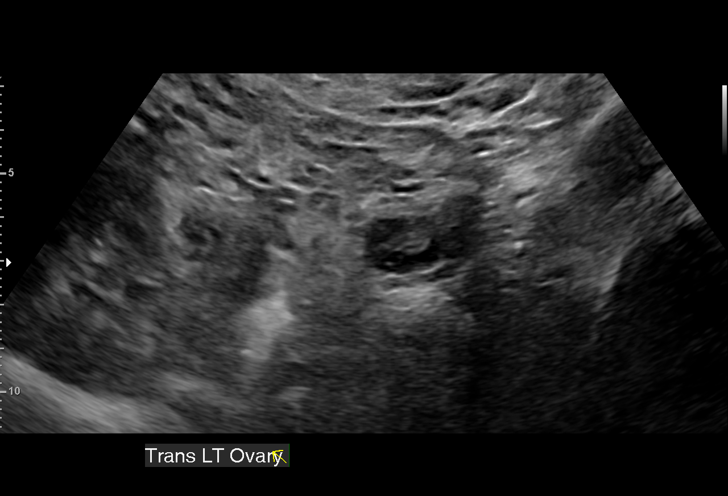
[im 22/43]
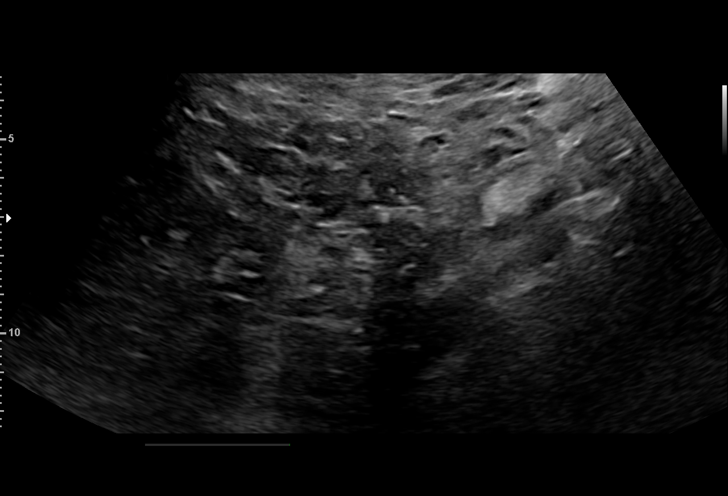
[im 24/43]
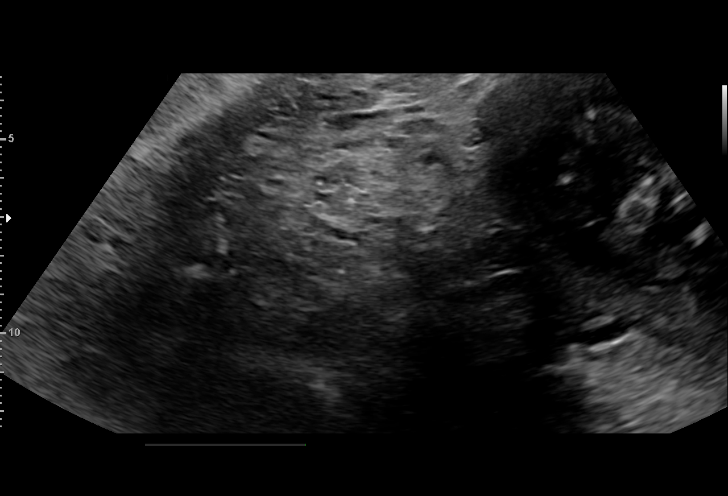
[im 27/43]
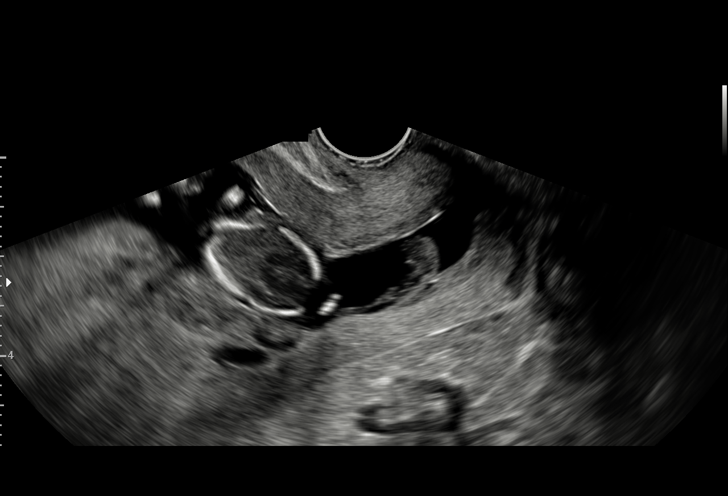
[im 30/43]
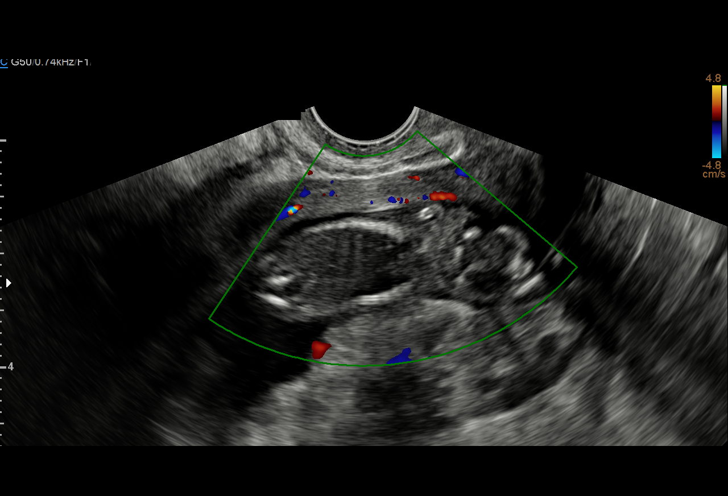
[im 33/43]
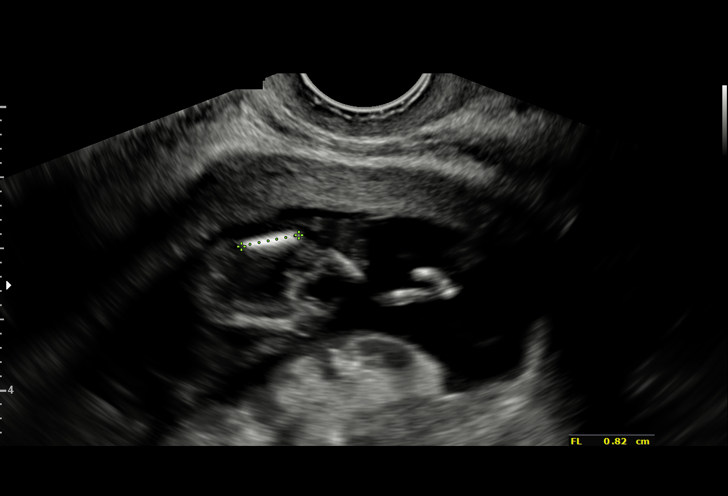
[im 36/43]
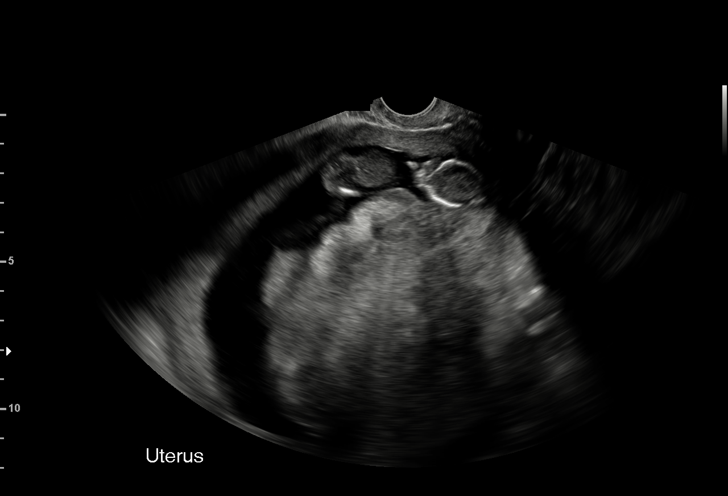
[im 39/43]
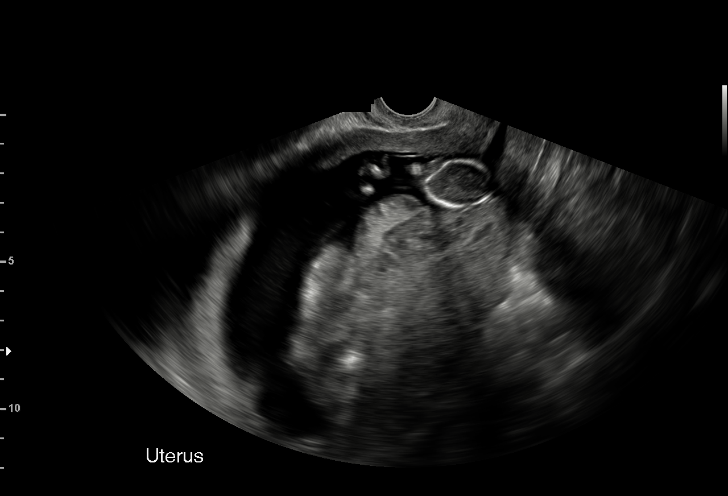
[im 43/43]
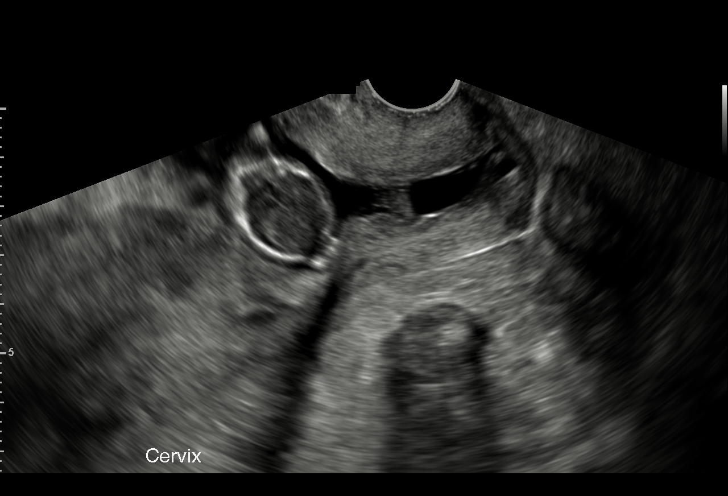

[15 of 28 positions shown; findings below may reference images not displayed]

FINDINGS: Intrauterine gestational sac: Single

Yolk sac:  Not visualized.

Embryo:  Present

Cardiac Activity: Negative.

Heart Rate: Negative.  bpm

CRL:  Approximate 54 mm.

BPD = 1.69 cm

Head circumference = 6.91 cm

Femoral length = 0.8 cm

Estimated gestational age 12 weeks and 3 days.

Subchorionic hemorrhage:  None visualized.

Maternal uterus/adnexae: Right ovary not seen. Left ovary normal. No
free fluid within the pelvis.

Prominent placenta seen posteriorly with heterogeneous echotexture.
Cervical canal distended to 10 mm to the level of the external os.
IMPRESSION: 1. Single intrauterine pregnancy, estimated gestational age 12 weeks
and 3 days, with no discernible cardiac activity. Findings meet
definitive criteria for failed pregnancy. This follows SRU consensus
guidelines: Diagnostic Criteria for Nonviable Pregnancy Early in the
First Trimester. N Engl J Med 0645;[DATE].
2. Cervical canal dilated to 10 mm to the level of the external os.

## 2018-12-07 ENCOUNTER — Other Ambulatory Visit: Payer: Self-pay | Admitting: Student

## 2021-04-09 ENCOUNTER — Other Ambulatory Visit: Payer: Self-pay

## 2021-04-09 ENCOUNTER — Inpatient Hospital Stay (HOSPITAL_BASED_OUTPATIENT_CLINIC_OR_DEPARTMENT_OTHER): Payer: Medicaid Other

## 2021-04-09 ENCOUNTER — Inpatient Hospital Stay (HOSPITAL_COMMUNITY): Payer: Medicaid Other

## 2021-04-09 ENCOUNTER — Encounter (HOSPITAL_COMMUNITY): Payer: Self-pay | Admitting: Obstetrics & Gynecology

## 2021-04-09 ENCOUNTER — Inpatient Hospital Stay (HOSPITAL_COMMUNITY)
Admission: AD | Admit: 2021-04-09 | Discharge: 2021-04-09 | Disposition: A | Discharge status: Home / Self Care | Payer: Medicaid Other | Attending: Obstetrics & Gynecology | Admitting: Obstetrics & Gynecology

## 2021-04-09 DIAGNOSIS — Z881 Allergy status to other antibiotic agents status: Secondary | ICD-10-CM | POA: Insufficient documentation

## 2021-04-09 DIAGNOSIS — Z3A12 12 weeks gestation of pregnancy: Secondary | ICD-10-CM

## 2021-04-09 DIAGNOSIS — F419 Anxiety disorder, unspecified: Secondary | ICD-10-CM

## 2021-04-09 DIAGNOSIS — Z794 Long term (current) use of insulin: Secondary | ICD-10-CM | POA: Insufficient documentation

## 2021-04-09 DIAGNOSIS — O039 Complete or unspecified spontaneous abortion without complication: Secondary | ICD-10-CM | POA: Diagnosis not present

## 2021-04-09 DIAGNOSIS — O99342 Other mental disorders complicating pregnancy, second trimester: Secondary | ICD-10-CM | POA: Diagnosis not present

## 2021-04-09 DIAGNOSIS — Z79899 Other long term (current) drug therapy: Secondary | ICD-10-CM | POA: Diagnosis not present

## 2021-04-09 DIAGNOSIS — Z885 Allergy status to narcotic agent status: Secondary | ICD-10-CM | POA: Insufficient documentation

## 2021-04-09 DIAGNOSIS — D62 Acute posthemorrhagic anemia: Secondary | ICD-10-CM | POA: Diagnosis not present

## 2021-04-09 DIAGNOSIS — O364XX0 Maternal care for intrauterine death, not applicable or unspecified: Secondary | ICD-10-CM | POA: Insufficient documentation

## 2021-04-09 DIAGNOSIS — O4691 Antepartum hemorrhage, unspecified, first trimester: Secondary | ICD-10-CM | POA: Diagnosis not present

## 2021-04-09 DIAGNOSIS — O24112 Pre-existing diabetes mellitus, type 2, in pregnancy, second trimester: Secondary | ICD-10-CM | POA: Diagnosis not present

## 2021-04-09 DIAGNOSIS — N939 Abnormal uterine and vaginal bleeding, unspecified: Secondary | ICD-10-CM | POA: Diagnosis present

## 2021-04-09 DIAGNOSIS — Z3A17 17 weeks gestation of pregnancy: Secondary | ICD-10-CM | POA: Insufficient documentation

## 2021-04-09 DIAGNOSIS — Z3492 Encounter for supervision of normal pregnancy, unspecified, second trimester: Secondary | ICD-10-CM

## 2021-04-09 DIAGNOSIS — O021 Missed abortion: Secondary | ICD-10-CM

## 2021-04-09 LAB — HCG, QUANTITATIVE, PREGNANCY: hCG, Beta Chain, Quant, S: 637 m[IU]/mL — ABNORMAL HIGH (ref <5)

## 2021-04-09 LAB — BASIC METABOLIC PANEL
Anion gap: 11 (ref 5–15)
BUN: 11 mg/dL (ref 6–20)
CO2: 23 mmol/L (ref 22–32)
Calcium: 8.5 mg/dL — ABNORMAL LOW (ref 8.9–10.3)
Chloride: 102 mmol/L (ref 98–111)
Creatinine, Ser: 0.53 mg/dL (ref 0.44–1.00)
GFR, Estimated: >60 mL/min (ref >60)
Glucose, Bld: 159 mg/dL — ABNORMAL HIGH (ref 70–99)
Potassium: 3.3 mmol/L — ABNORMAL LOW (ref 3.5–5.1)
Sodium: 136 mmol/L (ref 135–145)

## 2021-04-09 LAB — CBC
HCT: 31.6 % — ABNORMAL LOW (ref 36.0–46.0)
Hemoglobin: 9.9 g/dL — ABNORMAL LOW (ref 12.0–15.0)
MCH: 24.1 pg — ABNORMAL LOW (ref 26.0–34.0)
MCHC: 31.3 g/dL (ref 30.0–36.0)
MCV: 76.9 fL — ABNORMAL LOW (ref 80.0–100.0)
Platelets: 279 10*3/uL (ref 150–400)
RBC: 4.11 MIL/uL (ref 3.87–5.11)
RDW: 17.5 % — ABNORMAL HIGH (ref 11.5–15.5)
WBC: 12.2 10*3/uL — ABNORMAL HIGH (ref 4.0–10.5)
nRBC: 0 % (ref 0.0–0.2)

## 2021-04-09 LAB — POCT PREGNANCY, URINE: Preg Test, Ur: POSITIVE — AB

## 2021-04-09 LAB — URINALYSIS, MICROSCOPIC (REFLEX)

## 2021-04-09 LAB — URINALYSIS, ROUTINE W REFLEX MICROSCOPIC
Bilirubin Urine: NEGATIVE
Glucose, UA: NEGATIVE mg/dL
Ketones, ur: NEGATIVE mg/dL
Leukocytes,Ua: NEGATIVE
Nitrite: NEGATIVE
Protein, ur: NEGATIVE mg/dL
Specific Gravity, Urine: >1.03 — ABNORMAL HIGH (ref 1.005–1.030)
pH: 6 (ref 5.0–8.0)

## 2021-04-09 LAB — WET PREP, GENITAL
Sperm: NONE SEEN
Trich, Wet Prep: NONE SEEN
Yeast Wet Prep HPF POC: NONE SEEN

## 2021-04-09 MED ORDER — LORAZEPAM 0.5 MG PO TABS
1.0000 mg | ORAL_TABLET | Freq: Once | ORAL | Status: AC
  Administered 2021-04-09: 1 mg via ORAL
  Filled 2021-04-09: qty 2

## 2021-04-09 MED ORDER — RHO D IMMUNE GLOBULIN 1500 UNIT/2ML IJ SOSY
300.0000 ug | PREFILLED_SYRINGE | Freq: Once | INTRAMUSCULAR | Status: AC
  Administered 2021-04-09: 300 ug via INTRAMUSCULAR
  Filled 2021-04-09: qty 2

## 2021-04-09 MED ORDER — LORAZEPAM 1 MG PO TABS: 1.0000 mg | ORAL_TABLET | Freq: Three times a day (TID) | ORAL | 0 refills | Status: DC | PRN

## 2021-04-09 NOTE — MAU Note (Signed)
..  Cynthia Foster is a 35 y.o. at Unknown here in MAU reporting: Vaginal bleeding only when she wipes. A few days ago it was brown today it was bright red with clots. Denies abdominal pain.  LMP: May 9th, 2022 Pain score: 0/10 Vitals:   04/09/21 2026  BP: 134/84  Pulse: (!) 112  Resp: 19  Temp: 99.2 F (37.3 C)  SpO2: 100%   Lab orders placed from triage: POCT preg

## 2021-04-09 NOTE — MAU Provider Note (Signed)
Chief Complaint:  Vaginal Bleeding   Event Date/Time   First Provider Initiated Contact with Patient 04/09/21 2044     HPI: Cynthia Foster is a 35 y.o. [id number] at 16w2dwho presents to maternity admissions reporting vaginal bleeding, small amount.  No pain at all. Late to seek care because she was afraid she would have another loss like 3 yrs ago. (Was thought to be 18 wks but had 12 wk demise). She denies LOF, urinary symptoms, h/a, dizziness, n/v, diarrhea, constipation or fever/chills.   Vaginal Bleeding The patient's primary symptoms include vaginal bleeding. The patient's pertinent negatives include no genital itching, genital lesions, genital odor or pelvic pain. This is a new problem. The current episode started in the past 7 days. She is pregnant. Pertinent negatives include no abdominal pain, back pain, constipation, diarrhea, dysuria, fever, flank pain or frequency. The vaginal discharge was bloody. The vaginal bleeding is spotting. She has not been passing clots. She has not been passing tissue. Nothing aggravates the symptoms. She has tried nothing for the symptoms.    RN Note: Cynthia Foster is a 35 y.o. at Unknown here in MAU reporting: Vaginal bleeding only when she wipes. A few days ago it was brown today it was bright red with clots. Denies abdominal pain.  LMP: May 9th, 2022 Pain score: 0/10  Past Medical History: Past Medical History:  Diagnosis Date   Depression    Diabetes mellitus without complication (HCC)    Hypertension    Hypothyroidism     Past obstetric history: OB History  Gravida Para Term Preterm AB Living  5 3 3  0   3  SAB IAB Ectopic Multiple Live Births        0 3    # Outcome Date GA Lbr Len/2nd Weight Sex Delivery Anes PTL Lv  5 Current           4 Term 10/22/14 [redacted]w[redacted]d 17:45 / 01:12 4140 g M Vag-Spont EPI  LIV  3 Term 2010     Vag-Spont   LIV  2 Term 2009    F Vag-Spont   LIV  1 Gravida             Past Surgical History: Past Surgical  History:  Procedure Laterality Date   WISDOM TOOTH EXTRACTION      Family History: History reviewed. No pertinent family history.  Social History: Social History   Tobacco Use   Smoking status: Never   Smokeless tobacco: Never  Vaping Use   Vaping Use: Never used  Substance Use Topics   Alcohol use: No   Drug use: No    Allergies:  Allergies  Allergen Reactions   Sertraline Shortness Of Breath   Sertraline Hcl Shortness Of Breath    No rxn to any other SSRI.   Sertraline Hcl Shortness Of Breath   Cocoa Butter Rash   Azithromycin Swelling and Itching   Codeine    Hydrocodone Bit-Homatrop Mbr Itching   Labetalol     Drunk    Meds:  Medications Prior to Admission  Medication Sig Dispense Refill Last Dose   amLODipine (NORVASC) 10 MG tablet Take 10 mg by mouth daily.      atenolol (TENORMIN) 50 MG tablet Take 50 mg by mouth daily.      insulin aspart (NOVOLOG) 100 UNIT/ML injection Inject 20 Units into the skin 3 (three) times daily before meals.      insulin glargine (LANTUS) 100 UNIT/ML injection Inject 90 Units into  the skin daily.      PARoxetine (PAXIL) 20 MG tablet Take 1 tablet (20 mg total) by mouth daily. 30 tablet 2 Past Week   acetaminophen (TYLENOL) 500 MG tablet Take 500 mg by mouth every 6 (six) hours as needed for mild pain.      ALPRAZolam (XANAX) 1 MG tablet Take 1 tablet (1 mg total) by mouth at bedtime as needed for anxiety. 30 tablet 1    calcium carbonate (TUMS - DOSED IN MG ELEMENTAL CALCIUM) 500 MG chewable tablet Chew 1 tablet by mouth daily as needed for indigestion or heartburn.      ibuprofen (ADVIL,MOTRIN) 600 MG tablet Take 1 tablet (600 mg total) by mouth every 6 (six) hours as needed. (Patient not taking: Reported on 06/27/2018) 30 tablet 0    zolpidem (AMBIEN) 10 MG tablet Take 1 tablet (10 mg total) by mouth at bedtime as needed for sleep. 30 tablet 3     I have reviewed patient's Past Medical Hx, Surgical Hx, Family Hx, Social Hx,  medications and allergies.   ROS:  Review of Systems  Constitutional:  Negative for fever.  Gastrointestinal:  Negative for abdominal pain, constipation and diarrhea.  Genitourinary:  Positive for vaginal bleeding. Negative for dysuria, flank pain, frequency and pelvic pain.  Musculoskeletal:  Negative for back pain.  Other systems negative  Physical Exam  Patient Vitals for the past 24 hrs:  BP Temp Temp src Pulse Resp SpO2 Height Weight  04/09/21 2026 134/84 99.2 F (37.3 C) Oral (!) 112 19 100 % 5\' 2"  (1.575 m) 96.8 kg   Constitutional: Well-developed, well-nourished female in no acute distress.  Cardiovascular: normal rate and rhythm Respiratory: normal effort, clear to auscultation bilaterally GI: Abd soft, non-tender, gravid appropriate for gestational age.   No rebound or guarding. MS: Extremities nontender, no edema, normal ROM Neurologic: Alert and oriented x 4.  GU: Neg CVAT.  PELVIC EXAM: Cervix pink, visually closed, without lesion, scant red discharge, vaginal walls and external genitalia normal Bimanual exam: Cervix firm, posterior, neg CMT, uterus nontender, Fundal Height difficult to palpate due to habitus, adnexa without tenderness, enlargement, or mass   Labs: Results for JUPITER, BOYS (MRN Madaline Savage) as of 04/10/2021 04:21  Ref. Range 04/09/2021 21:12  Sodium Latest Ref Range: 135 - 145 mmol/L 136  Potassium Latest Ref Range: 3.5 - 5.1 mmol/L 3.3 (L)  Chloride Latest Ref Range: 98 - 111 mmol/L 102  CO2 Latest Ref Range: 22 - 32 mmol/L 23  Glucose Latest Ref Range: 70 - 99 mg/dL 06/09/2021 (H)  BUN Latest Ref Range: 6 - 20 mg/dL 11  Creatinine Latest Ref Range: 0.44 - 1.00 mg/dL 174  Calcium Latest Ref Range: 8.9 - 10.3 mg/dL 8.5 (L)  Anion gap Latest Ref Range: 5 - 15  11  GFR, Estimated Latest Ref Range: >60 mL/min >60  WBC Latest Ref Range: 4.0 - 10.5 K/uL 12.2 (H)  RBC Latest Ref Range: 3.87 - 5.11 MIL/uL 4.11  Hemoglobin Latest Ref Range: 12.0 - 15.0 g/dL  9.9 (L)  HCT Latest Ref Range: 36.0 - 46.0 % 31.6 (L)  MCV Latest Ref Range: 80.0 - 100.0 fL 76.9 (L)  MCH Latest Ref Range: 26.0 - 34.0 pg 24.1 (L)  MCHC Latest Ref Range: 30.0 - 36.0 g/dL 0.81  RDW Latest Ref Range: 11.5 - 15.5 % 17.5 (H)  Platelets Latest Ref Range: 150 - 400 K/uL 279  nRBC Latest Ref Range: 0.0 - 0.2 % 0.0  HCG,  Beta Chain, Quant, S Latest Ref Range: <5 mIU/mL 637 (H)    Imaging:  US OB Comp Less 14 Wks  Result Date: 04/09/2021 CLINICAL DATA:  Vaginal bleeding EXAM: OBSTETRIC <14 WK ULTRASOUND TECHNIQUE: Transabdominal ultrasound was performed for evaluation of the gestation as well as the maternal uterus and adnexal regions. COMPARISON:  None. FINDINGS: Intrauterine gestational sac: Single Yolk sac:  Not visualized Embryo:  Visualized Cardiac Activity: Not visualized CRL:   62.7 mm   12 w 4 d                  [place] EDC: 10/18/2021 Subchorionic hemorrhage:  None visualized. Maternal uterus/adnexae: Ovaries are within normal limits. Right ovary measures 4.1 x 2.6 by 3.4 cm. Left ovary measures 2.3 x 2.1 x 2.5 cm. No significant free fluid IMPRESSION: Single intrauterine pregnancy with visible embryo with crown-rump length of 62.7 mm and no fetal cardiac activity identified. Findings meet definitive criteria for failed pregnancy. This follows SRU consensus guidelines: Diagnostic Criteria for Nonviable Pregnancy Early in the First Trimester. Macy Mis J Med [phone]. Electronically Signed   By: Jasmine Pang M.D.   On: 04/09/2021 22:01     MAU Course/MDM: I have ordered labs and reviewed results. HCG is low for gestational age Reviewed findings with patient who is distraught and grieving  Consult Dr Charlotta Newton with presentation, exam findings and test results. Recommends consultation and followup at [facility] per patient's wishes  Dose of Ativan given prior to discharge Dr Su Hilt updated and will arrange followup with Horald Chestnut CNM  Assessment: Single IUP at [redacted]w[redacted]d by LMP, 12  wks by [place] Intrauterine fetal demise Anxiety  Plan: Discharge home SAB precautions Follow up in Office for decision-making.  Pt prefers not to have D&C Encouraged to return if she develops worsening of symptoms, increase in pain, fever, or other concerning symptoms.    Pt stable at time of discharge.  Wynelle Bourgeois CNM, MSN Certified Nurse-Midwife 04/09/2021 8:44 PM

## 2021-04-10 ENCOUNTER — Inpatient Hospital Stay (HOSPITAL_COMMUNITY)
Admission: AD | Admit: 2021-04-10 | Discharge: 2021-04-10 | Disposition: A | Discharge status: Home / Self Care | Payer: Medicaid Other | Attending: Obstetrics and Gynecology | Admitting: Obstetrics and Gynecology

## 2021-04-10 ENCOUNTER — Encounter (HOSPITAL_COMMUNITY): Payer: Self-pay | Admitting: Obstetrics & Gynecology

## 2021-04-10 ENCOUNTER — Inpatient Hospital Stay (HOSPITAL_COMMUNITY): Payer: Medicaid Other

## 2021-04-10 DIAGNOSIS — D500 Iron deficiency anemia secondary to blood loss (chronic): Secondary | ICD-10-CM | POA: Diagnosis not present

## 2021-04-10 DIAGNOSIS — O99012 Anemia complicating pregnancy, second trimester: Secondary | ICD-10-CM | POA: Diagnosis not present

## 2021-04-10 DIAGNOSIS — O26892 Other specified pregnancy related conditions, second trimester: Secondary | ICD-10-CM | POA: Diagnosis present

## 2021-04-10 DIAGNOSIS — O039 Complete or unspecified spontaneous abortion without complication: Secondary | ICD-10-CM

## 2021-04-10 DIAGNOSIS — R102 Pelvic and perineal pain: Secondary | ICD-10-CM | POA: Diagnosis not present

## 2021-04-10 DIAGNOSIS — F419 Anxiety disorder, unspecified: Secondary | ICD-10-CM | POA: Diagnosis not present

## 2021-04-10 DIAGNOSIS — D62 Acute posthemorrhagic anemia: Secondary | ICD-10-CM

## 2021-04-10 DIAGNOSIS — Z3A17 17 weeks gestation of pregnancy: Secondary | ICD-10-CM | POA: Insufficient documentation

## 2021-04-10 LAB — CBC
HCT: 26.5 % — ABNORMAL LOW (ref 36.0–46.0)
Hemoglobin: 8.1 g/dL — ABNORMAL LOW (ref 12.0–15.0)
MCH: 23.7 pg — ABNORMAL LOW (ref 26.0–34.0)
MCHC: 30.6 g/dL (ref 30.0–36.0)
MCV: 77.5 fL — ABNORMAL LOW (ref 80.0–100.0)
Platelets: 256 10*3/uL (ref 150–400)
RBC: 3.42 MIL/uL — ABNORMAL LOW (ref 3.87–5.11)
RDW: 17.7 % — ABNORMAL HIGH (ref 11.5–15.5)
WBC: 14.2 10*3/uL — ABNORMAL HIGH (ref 4.0–10.5)
nRBC: 0 % (ref 0.0–0.2)

## 2021-04-10 LAB — RH IG WORKUP (INCLUDES ABO/RH)
ABO/RH(D): A NEG
Antibody Screen: NEGATIVE
Gestational Age(Wks): 18
Unit division: 0

## 2021-04-10 LAB — GC/CHLAMYDIA PROBE AMP (~~LOC~~) NOT AT ARMC
Chlamydia: NEGATIVE
Comment: NEGATIVE
Comment: NORMAL
Neisseria Gonorrhea: NEGATIVE

## 2021-04-10 LAB — PREPARE RBC (CROSSMATCH)

## 2021-04-10 MED ORDER — ACETAMINOPHEN 325 MG PO TABS
650.0000 mg | ORAL_TABLET | Freq: Once | ORAL | Status: AC
  Administered 2021-04-10: 650 mg via ORAL
  Filled 2021-04-10: qty 2

## 2021-04-10 MED ORDER — DIPHENHYDRAMINE HCL 25 MG PO CAPS
25.0000 mg | ORAL_CAPSULE | Freq: Once | ORAL | Status: AC
  Administered 2021-04-10: 25 mg via ORAL
  Filled 2021-04-10: qty 1

## 2021-04-10 MED ORDER — SODIUM CHLORIDE 0.9% IV SOLUTION: Freq: Once | INTRAVENOUS | Status: AC

## 2021-04-10 MED ORDER — KETOROLAC TROMETHAMINE 30 MG/ML IJ SOLN
30.0000 mg | Freq: Once | INTRAMUSCULAR | Status: AC
  Administered 2021-04-10: 30 mg via INTRAVENOUS
  Filled 2021-04-10: qty 1

## 2021-04-10 MED ORDER — LORAZEPAM 0.5 MG PO TABS
1.0000 mg | ORAL_TABLET | Freq: Once | ORAL | Status: AC
  Administered 2021-04-10: 1 mg via ORAL
  Filled 2021-04-10: qty 2

## 2021-04-10 MED ORDER — OXYCODONE HCL 5 MG PO TABS
5.0000 mg | ORAL_TABLET | Freq: Once | ORAL | Status: AC
  Administered 2021-04-10: 5 mg via ORAL
  Filled 2021-04-10: qty 1

## 2021-04-10 NOTE — Discharge Instructions (Signed)
Go to [facility] on Friday, Sept 9 at 11:15

## 2021-04-10 NOTE — MAU Note (Addendum)
Pt Woke up when entered room.  Reports mild cramping, face and head feel tingly, thinks it might be her nerves. Family present in rm.

## 2021-04-10 NOTE — Progress Notes (Signed)
Chaplain responded to consult for IUFD. Cynthia Foster reports she has spent the last several days aware that something was not right with her baby. Three years ago, she lost a pregnancy with her son Cynthia Foster. Cynthia Foster was 20 weeks, but her son had stopped developing at 12 weeks. Around midnight, she delivered this pregnancy, which she reports is a boy she has named Cynthia Foster. Cynthia Foster shared that she was [redacted] weeks along with her pregnancy, but baby appears to have stopped developing at 12 weeks. Cynthia Foster shared how closely she has been holding the pregnancy and acknowledges that fears about her early loss and her family's response inspired her to try to keep this pregnancy more private, however she is already aware of ways they have shared and responded inappropriately. Chaplain asked open ended questions to facilitate emotional expression and story telling. Chaplain provided grief education including information about normal grief responses, managing boundaries with individuals whose grief response is unhelpful or hurtful, supporting children through grief, and honoring a loved one's legacy. Chaplain facilitated Cynthia Foster and memory making and linked to community grief support resources.   Please page as further needs arise.  Cynthia Foster. Cynthia Foster, M.Div. [facility] Chaplain Pager [phone] Office [phone]       04/10/21 1053  Clinical Encounter Type  Visited With Patient and family together  Visit Type Initial;Death  Referral From Nurse;Physician  Spiritual Encounters  Spiritual Needs Emotional;Grief support  Stress Factors  Patient Stress Factors Family relationships;Financial concerns;Health changes;Loss;Loss of control;Major life changes  Family Stress Factors Family relationships;Financial concerns;Health changes;Lack of caregivers;Loss;Loss of control;Major life changes

## 2021-04-10 NOTE — MAU Note (Signed)
Pt wanted baby to be picked up before she left.  Gypsy Balsam, [facility] came and picked up baby to take to morgue, - will be released to [facility] for cremation.

## 2021-04-10 NOTE — MAU Provider Note (Signed)
Chief Complaint:  Vaginal Bleeding (12wk delivery at home) Seen by provider at 0255hrs  HPI: ROSETTA RUPNOW is a 35 y.o. [phone] who presents via EMS to maternity admissions reporting Sudden ROM with delivery of demised fetus at home, soon after leaving here.  Was seen her earlier for small amount of bleeding and diagnosed with fetal demise  Was thought to be 17 wks and fetus measured 12 wks.  Had no pain then.  States was standing outside and felt a gush or water and soon after passed fetus. Had very heavy bleeding afterward, EMS states BP was low for a while  They started IV and gave her IV fluids and analgesia. . She reports vaginal bleeding, dizziness, but no n/v, or fever/chills.    Vaginal Bleeding The patient's primary symptoms include pelvic pain and vaginal bleeding. The patient's pertinent negatives include no genital itching, genital lesions or genital odor. This is a recurrent problem. The current episode started today. She is pregnant. Associated symptoms include abdominal pain. Pertinent negatives include no chills, constipation, diarrhea, dysuria or fever. The vaginal discharge was bloody. The vaginal bleeding is heavier than menses. She has been passing clots. She has been passing tissue. Nothing aggravates the symptoms. She has tried nothing for the symptoms.  RN Note: TRINITI GRUETZMACHER is a 35 y.o. at [redacted]w[redacted]d here in MAU reporting via EMS: delivered at home and had a lot of bleeding began to feel faint. Per EMS was given fentanyl and 1,600 mLs of NS.  Provider Wynelle Bourgeois, CNM at bedside.  Patient reports pain is a 4/10  Past Medical History: Past Medical History:  Diagnosis Date   Depression    Diabetes mellitus without complication (HCC)    Hypertension    Hypothyroidism     Past obstetric history: OB History  Gravida Para Term Preterm AB Living  5 3 3  0 1 3  SAB IAB Ectopic Multiple Live Births  1     0 3    # Outcome Date GA Lbr Len/2nd Weight Sex Delivery Anes  PTL Lv  5 Current           4 SAB 2019 [redacted]w[redacted]d    SAB     3 Term 10/22/14 [redacted]w[redacted]d 17:45 / 01:12 4140 g M Vag-Spont EPI  LIV  2 Term 2010     Vag-Spont   LIV  1 Term 2009    F Vag-Spont   LIV    Past Surgical History: Past Surgical History:  Procedure Laterality Date   WISDOM TOOTH EXTRACTION      Family History: History reviewed. No pertinent family history.  Social History: Social History   Tobacco Use   Smoking status: Never   Smokeless tobacco: Never  Vaping Use   Vaping Use: Never used  Substance Use Topics   Alcohol use: No   Drug use: No    Allergies:  Allergies  Allergen Reactions   Hydrocodone Bit-Homatrop Mbr Itching   Iodinated Diagnostic Agents Itching    Pt c/o itching in the arm which she received IV dye in after CT exam.   Sertraline Shortness Of Breath   Sertraline Hcl Shortness Of Breath    No rxn to any other SSRI.   Sertraline Hcl Shortness Of Breath   Cocoa Butter Rash   Azithromycin Swelling and Itching   Codeine    Labetalol     Drunk    Meds:  Medications Prior to Admission  Medication Sig Dispense Refill Last Dose  acetaminophen (TYLENOL) 500 MG tablet Take 500 mg by mouth every 6 (six) hours as needed for mild pain.      amLODipine (NORVASC) 10 MG tablet Take 10 mg by mouth daily.      atenolol (TENORMIN) 50 MG tablet Take 50 mg by mouth daily.      calcium carbonate (TUMS - DOSED IN MG ELEMENTAL CALCIUM) 500 MG chewable tablet Chew 1 tablet by mouth daily as needed for indigestion or heartburn.      ibuprofen (ADVIL,MOTRIN) 600 MG tablet Take 1 tablet (600 mg total) by mouth every 6 (six) hours as needed. (Patient not taking: Reported on 06/27/2018) 30 tablet 0    insulin aspart (NOVOLOG) 100 UNIT/ML injection Inject 20 Units into the skin 3 (three) times daily before meals.      insulin glargine (LANTUS) 100 UNIT/ML injection Inject 90 Units into the skin daily.      LORazepam (ATIVAN) 1 MG tablet Take 1 tablet (1 mg total) by mouth  every 8 (eight) hours as needed for anxiety. 10 tablet 0    PARoxetine (PAXIL) 20 MG tablet Take 1 tablet (20 mg total) by mouth daily. 30 tablet 2    zolpidem (AMBIEN) 10 MG tablet Take 1 tablet (10 mg total) by mouth at bedtime as needed for sleep. 30 tablet 3     I have reviewed patient's Past Medical Hx, Surgical Hx, Family Hx, Social Hx, medications and allergies.  ROS:  Review of Systems  Constitutional:  Negative for chills and fever.  Gastrointestinal:  Positive for abdominal pain. Negative for constipation and diarrhea.  Genitourinary:  Positive for pelvic pain and vaginal bleeding. Negative for dysuria.  Other systems negative     Physical Exam  Patient Vitals for the past 24 hrs:  BP Temp Temp src Pulse Resp SpO2  04/10/21 0500 117/77 -- -- (!) 106 19 100 %  04/10/21 0331 127/73 98.4 F (36.9 C) Oral (!) 107 18 100 %   Constitutional: Well-developed, well-nourished female in no acute distress.  Cardiovascular: normal rate and rhythm Respiratory: normal effort, no distress.  GI: Abd soft, non-tender.  Nondistended.  No rebound, No guarding.    MS: Extremities nontender, no edema, normal ROM Neurologic: Alert and oriented x 4.   Grossly nonfocal. GU: Neg CVAT. Skin:  Warm and Dry Psych:  Affect appropriate.  PELVIC EXAM: Large amount of clotted blood on pad Speculum inserted.  Removed large amounts of clots and placental tissue. EBL probably here.  EMS stated large EBL there.  Two large pieces of placenta removed and sent to pathology   Labs: Results for orders placed or performed during the hospital encounter of 04/10/21 (from the past 24 hour(s))  CBC     Status: Abnormal   Collection Time: 04/10/21  3:39 AM  Result Value Ref Range   WBC 14.2 (H) 4.0 - 10.5 K/uL   RBC 3.42 (L) 3.87 - 5.11 MIL/uL   Hemoglobin 8.1 (L) 12.0 - 15.0 g/dL   HCT 92.3 (L) 30.0 - 76.2 %   MCV 77.5 (L) 80.0 - 100.0 fL   MCH 23.7 (L) 26.0 - 34.0 pg   MCHC 30.6 30.0 - 36.0 g/dL    RDW 26.3 (H) 33.5 - 15.5 %   Platelets 256 150 - 400 K/uL   nRBC 0.0 0.0 - 0.2 %  Type and screen     Status: None (Preliminary result)   Collection Time: 04/10/21  3:39 AM  Result Value Ref Range  ABO/RH(D) A NEG    Antibody Screen NEG    Sample Expiration 04/13/2021,2359    Unit Number [id number]    Blood Component Type RBC LR PHER2    Unit division 00    Status of Unit ALLOCATED    Transfusion Status OK TO TRANSFUSE    Crossmatch Result      Compatible Performed at [facility], [address] [place], [place] [address]   Prepare RBC (crossmatch)     Status: None   Collection Time: 04/10/21  5:05 AM  Result Value Ref Range   Order Confirmation      ORDER PROCESSED BY BLOOD BANK Performed at [facility], [address] [place], [place] [address]    --/--/A NEG (09/08 [id number])  Imaging:  US OB Comp Less 14 Wks  Result Date: 04/09/2021 CLINICAL DATA:  Vaginal bleeding EXAM: OBSTETRIC <14 WK ULTRASOUND TECHNIQUE: Transabdominal ultrasound was performed for evaluation of the gestation as well as the maternal uterus and adnexal regions. COMPARISON:  None. FINDINGS: Intrauterine gestational sac: Single Yolk sac:  Not visualized Embryo:  Visualized Cardiac Activity: Not visualized CRL:   62.7 mm   12 w 4 d                  US EDC: 10/18/2021 Subchorionic hemorrhage:  None visualized. Maternal uterus/adnexae: Ovaries are within normal limits. Right ovary measures 4.1 x 2.6 by 3.4 cm. Left ovary measures 2.3 x 2.1 x 2.5 cm. No significant free fluid IMPRESSION: Single intrauterine pregnancy with visible embryo with crown-rump length of 62.7 mm and no fetal cardiac activity identified. Findings meet definitive criteria for failed pregnancy. This follows SRU consensus guidelines: Diagnostic Criteria for Nonviable Pregnancy Early in the First Trimester. Macy Mis Engl J Med [id number];369:1443-51. Electronically Signed   By: Jasmine PangKim  Fujinaga M.D.   On: 04/09/2021 22:01   US Pelvis  Limited  Result Date: 04/10/2021 CLINICAL DATA:  35 year old female with bleeding status post failed pregnancy, spontaneous abortion. EXAM: TRANSABDOMINAL ULTRASOUND OF PELVIS TECHNIQUE: Transabdominal ultrasound examination of the pelvis was performed including evaluation of the uterus, ovaries, adnexal regions, and pelvic cul-de-sac. COMPARISON:  Ob ultrasound 2130 hours yesterday. FINDINGS: Uterus Measurements: 13.4 x 8.0 x 8.5 cm = volume: 474 mL. No fibroids or other mass visualized. Endometrium Thickness: 31 mm (image 18). Mildly heterogeneous endometrium with regional mild hypervascularity on Doppler (image 9). But no discrete or highly vascular mass. Right ovary Measurements: 3.1 x 2.1 x 2.9 cm = volume: 6 mL. Normal appearance/no adnexal mass. Left ovary Measurements: 3.5 x 2.1 x 1.9 cm = volume: 7 mL. Normal appearance/no adnexal mass. Other findings:  No pelvic free fluid. IMPRESSION: Heterogeneously thickened endometrium following failed IUP and SAB. But no discrete retained products of conception. Electronically Signed   By: Odessa FlemingH  Hall M.D.   On: 04/10/2021 04:39    MAU Course/MDM: I have ordered labs as follows:  CBC and Type and cross.  Hgb is down to 8.1  Patient feels dizzy and weak.   Imaging ordered: US which found no retained POCs Results reviewed.   Consult Dr Charlotta Newtonzan.   Treatments in MAU included Discussed anemia from blood loss.  Counseled on conservative care with iron/fluids, Iron transfusion or blood transfusion   patient would like transfusion because she is so symptomatic. .    Will transfuse one unit of blood to minimize risk and it should raise her Hgb enough for her to feel better   Felt better after transfusion.  We gave her 1mg  of Ativan for anxiety prior to discharge.   Assessment: Status post spontaneous abortion  Blood loss due to above Blood loss anemia  Plan: Discharge home Recommend supportive care Has appt at office for followup I called Dr again to  update her on delivery  Encouraged to return here or to other Urgent Care/ED if she develops worsening of symptoms, increase in pain, fever, or other concerning symptoms.   Su Hilt CNM, MSN Certified Nurse-Midwife 04/10/2021 5:18 AM

## 2021-04-10 NOTE — MAU Note (Signed)
..  Cynthia Foster is a 35 y.o. at [redacted]w[redacted]d here in MAU reporting via EMS: delivered at home and had a lot of bleeding began to feel faint. Per EMS was given fentanyl and 1,600 mLs of NS.  Provider Wynelle Bourgeois, CNM at bedside.  Patient reports pain is a 4/10

## 2021-04-11 LAB — BPAM RBC
Blood Product Expiration Date: 202209262359
ISSUE DATE / TIME: 202209080520
Unit Type and Rh: 600

## 2021-04-11 LAB — SURGICAL PATHOLOGY

## 2021-04-11 LAB — TYPE AND SCREEN
ABO/RH(D): A NEG
Antibody Screen: NEGATIVE
Unit division: 0

## 2021-04-12 ENCOUNTER — Encounter (HOSPITAL_COMMUNITY): Payer: Self-pay | Admitting: Obstetrics and Gynecology

## 2021-04-12 ENCOUNTER — Inpatient Hospital Stay (HOSPITAL_COMMUNITY)
Admission: AD | Admit: 2021-04-12 | Discharge: 2021-04-12 | Disposition: A | Discharge status: Home / Self Care | Payer: Medicaid Other | Attending: Obstetrics and Gynecology | Admitting: Obstetrics and Gynecology

## 2021-04-12 ENCOUNTER — Other Ambulatory Visit: Payer: Self-pay

## 2021-04-12 DIAGNOSIS — O09299 Supervision of pregnancy with other poor reproductive or obstetric history, unspecified trimester: Secondary | ICD-10-CM

## 2021-04-12 DIAGNOSIS — D508 Other iron deficiency anemias: Secondary | ICD-10-CM | POA: Diagnosis not present

## 2021-04-12 DIAGNOSIS — O9081 Anemia of the puerperium: Secondary | ICD-10-CM | POA: Insufficient documentation

## 2021-04-12 DIAGNOSIS — F430 Acute stress reaction: Secondary | ICD-10-CM

## 2021-04-12 LAB — HEMOGLOBIN AND HEMATOCRIT, BLOOD
HCT: 29.3 % — ABNORMAL LOW (ref 36.0–46.0)
Hemoglobin: 9.2 g/dL — ABNORMAL LOW (ref 12.0–15.0)

## 2021-04-12 MED ORDER — SODIUM CHLORIDE 0.9 % IV SOLN: INTRAVENOUS | Status: DC

## 2021-04-12 MED ORDER — ACETAMINOPHEN 325 MG PO TABS
650.0000 mg | ORAL_TABLET | Freq: Once | ORAL | Status: AC
  Administered 2021-04-12: 650 mg via ORAL
  Filled 2021-04-12: qty 2

## 2021-04-12 MED ORDER — LACTATED RINGERS IV BOLUS: 1000.0000 mL | Freq: Once | INTRAVENOUS | Status: DC

## 2021-04-12 MED ORDER — ZOLPIDEM TARTRATE 10 MG PO TABS: 10.0000 mg | ORAL_TABLET | Freq: Every evening | ORAL | 3 refills | Status: AC | PRN

## 2021-04-12 MED ORDER — ONDANSETRON 4 MG PO TBDP
8.0000 mg | ORAL_TABLET | Freq: Once | ORAL | Status: AC
  Administered 2021-04-12: 8 mg via ORAL
  Filled 2021-04-12: qty 2

## 2021-04-12 MED ORDER — ALPRAZOLAM 0.5 MG PO TABS: 0.5000 mg | ORAL_TABLET | Freq: Two times a day (BID) | ORAL | 0 refills | Status: AC | PRN

## 2021-04-12 MED ORDER — SODIUM CHLORIDE 0.9 % IV SOLN
500.0000 mg | Freq: Once | INTRAVENOUS | Status: AC
  Administered 2021-04-12: 500 mg via INTRAVENOUS
  Filled 2021-04-12 (×2): qty 25

## 2021-04-12 NOTE — MAU Note (Signed)
IV site clear without redness, streaking or swelling. Infusion continues. Patient denies pain, needs or concerns.

## 2021-04-12 NOTE — MAU Provider Note (Addendum)
S Cynthia Foster is a 35 y.o. [phone] non-pregnant female on PPD#2 who presents to MAU today insistent that her midwife, Cynthia Foster called her at home last night and instructed her to come to MAU today for another blood transfusion. She delivered a 12wk IUFD two days ago, got a unit of blood and was sent home with close follow up. She is still very lightheaded, dizzy, fatigued, weak and SOB upon exertion. Also reports severe emotional distress and has not slept since the delivery. Her Paxil was increased and she was prescribed valium but says neither has helped. Was given Ambien and Xanax after her last IUFD and says that helped much better. FOB at bedside, very engaged and supportive.  Receives care from [facility], records reviewed.  Pertinent items noted in HPI and remainder of comprehensive ROS otherwise negative.  O BP 118/78 (BP Location: Right Arm)   Pulse 84   Temp 98.1 F (36.7 C) (Oral)   Resp 18   LMP 12/09/2020   SpO2 100%   Breastfeeding Unknown  Physical Exam Vitals and nursing note reviewed.  Constitutional:      General: She is in acute distress (crying and very upset).     Appearance: Normal appearance. She is not ill-appearing.  HENT:     Head: Normocephalic and atraumatic.  Eyes:     Pupils: Pupils are equal, round, and reactive to light.     Comments: Pale conjunctiva   Cardiovascular:     Rate and Rhythm: Normal rate and regular rhythm.     Pulses: Normal pulses.  Pulmonary:     Effort: Pulmonary effort is normal.  Abdominal:     Palpations: Abdomen is soft.     Tenderness: There is no abdominal tenderness.  Musculoskeletal:        General: Normal range of motion.     Cervical back: Normal range of motion.  Skin:    General: Skin is warm.     Capillary Refill: Capillary refill takes less than 2 seconds.     Coloration: Skin is pale.  Neurological:     Mental Status: She is alert and oriented to person, place, and time.   Psychiatric:        Mood and Affect: Mood normal.        Behavior: Behavior normal.        Thought Content: Thought content normal.        Judgment: Judgment normal.   Results for orders placed or performed during the hospital encounter of 04/12/21 (from the past 24 hour(s))  Hemoglobin and hematocrit, blood     Status: Abnormal   Collection Time: 04/12/21  2:57 PM  Result Value Ref Range   Hemoglobin 9.2 (L) 12.0 - 15.0 g/dL   HCT 29.9 (L) 37.1 - 69.6 %    Hemoglobin increased from 8.9 yesterday to 9.2. Reviewed results with patient and recommended IV venofer with a value of 9.2. Also offered to switch her meds from valium to ambien/xanax. Pt and FOB amenable to a fluid bolus and IV venofer in MAU then discharge home. (Pt found to be prescribed ativan, discontinued and put on xanax and ambien.)  Reviewed importance of not taking paxil, valium, ambien and xanax, pt expressed understanding and stated the valium has not worked for her anyway. Gave her info on the [facility] urgent care and encouraged her to go there if her grief stays as intense and she is still unable to sleep. Reviewed warning signs  of pp psychosis with she and FOB, he expressed appreciation for the information. She expressed assurance that the change in meds will help, as will the IV iron. Appeared much calmer after our discussion.  NS bolus and IV venofer given started Care turned over to Gerrit Heck, CNM at 2114  A Postpartum anemia Grief  P Discharge from MAU in stable condition with return precautions Follow up at [facility] as scheduled for postpartum follow up Warning signs for worsening condition that would warrant emergency follow-up here or at [facility] discussed  Bernerd Limbo, CNM 04/12/2021 3:56 PM   Reassessment (10:43 PM)  -Nurse reports infusion to be complete at ~ 2300. -No apparent reactions reported. -Patient requests and given tylenol. -Plan to follow up with primary  ob. -Encouraged to call primary office or return to MAU if symptoms worsen or with the onset of new symptoms. -Discharged to home in stable condition.  Cherre Robins MSN, CNM Advanced Practice Provider, [facility] [employer]

## 2021-04-12 NOTE — MAU Note (Signed)
Infusion continues - pt denies complaints or concerns-IV site clear

## 2021-04-12 NOTE — MAU Note (Signed)
Presents stating she's here to receive a blood transfusion. States she had a miscarriage on 04/10/2021, and received a blood transfusion.  Reports continues to have some VB.  Seen yesterday in office for f/u.

## 2021-10-08 IMAGING — US US PELVIS LIMITED
1 series · 15 of 25 positions shown · non-contrast
Comparison: Ob ultrasound 0483 hours yesterday.

CLINICAL DATA: 35-year-old female with bleeding status post failed
pregnancy, spontaneous abortion.

EXAM:
TRANSABDOMINAL ULTRASOUND OF PELVIS
TECHNIQUE: Transabdominal ultrasound examination of the pelvis was performed
including evaluation of the uterus, ovaries, adnexal regions, and
pelvic cul-de-sac.

[Series 1: us pelvis limited · 38 acquisitions, 15 frames shown]
[im 1/38]
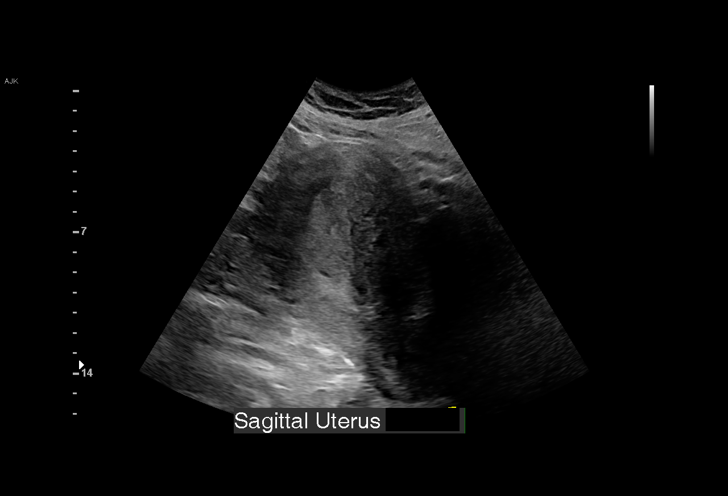
[im 4/38]
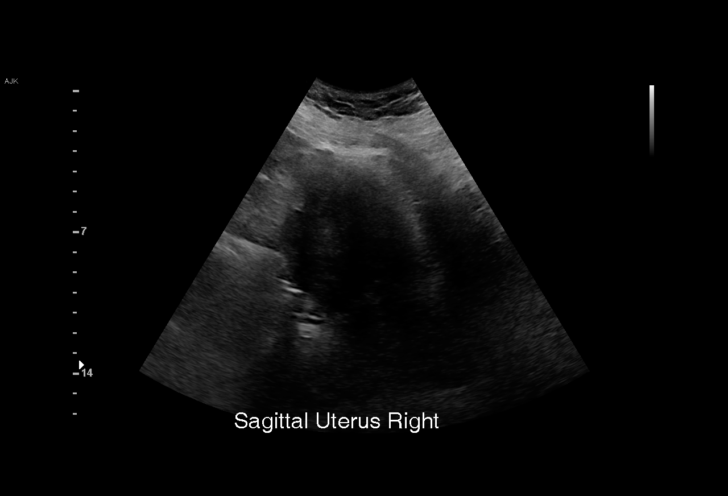
[im 7/38]
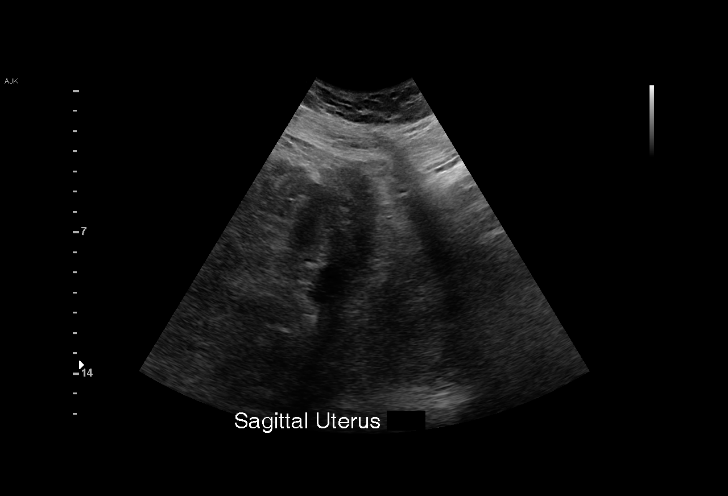
[im 8/38]
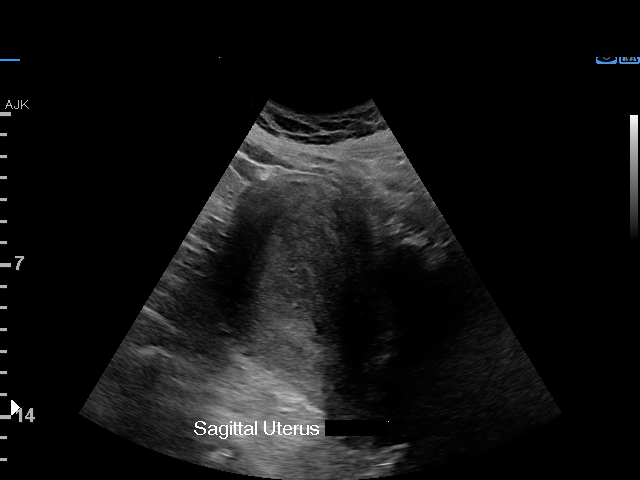
[im 11/38]
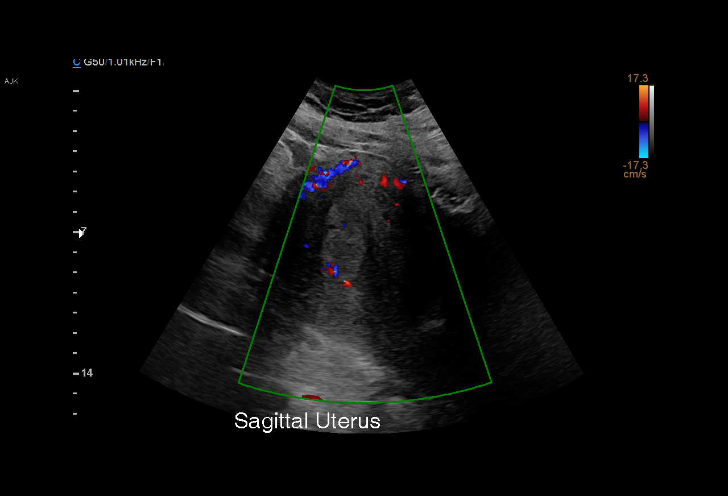
[im 14/38]
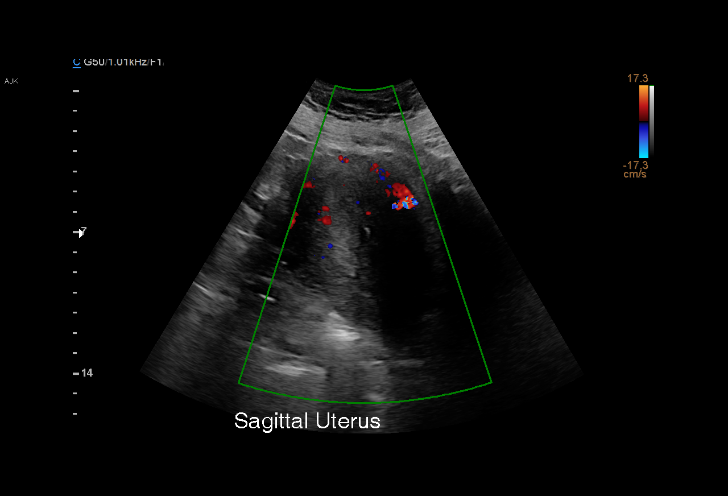
[im 16/38]
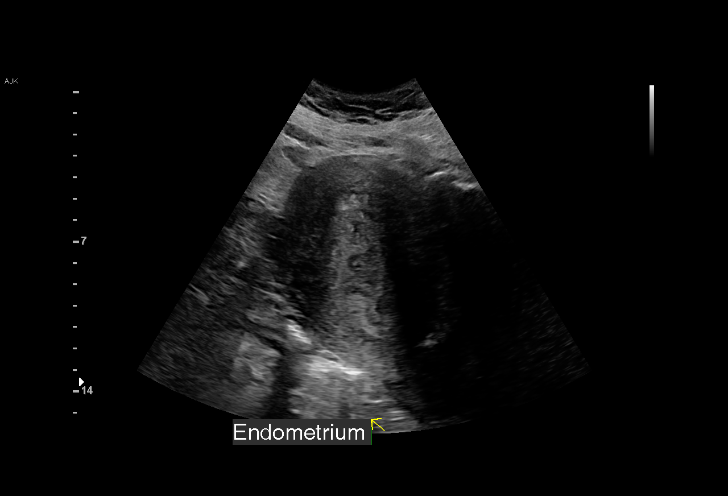
[im 19/38]
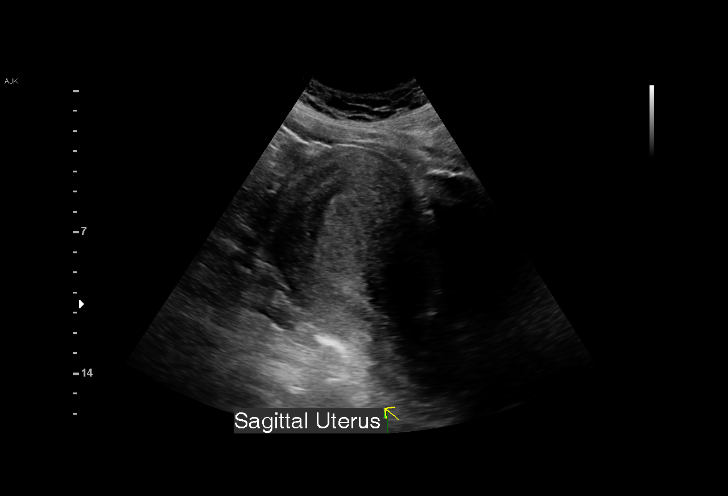
[im 22/38]
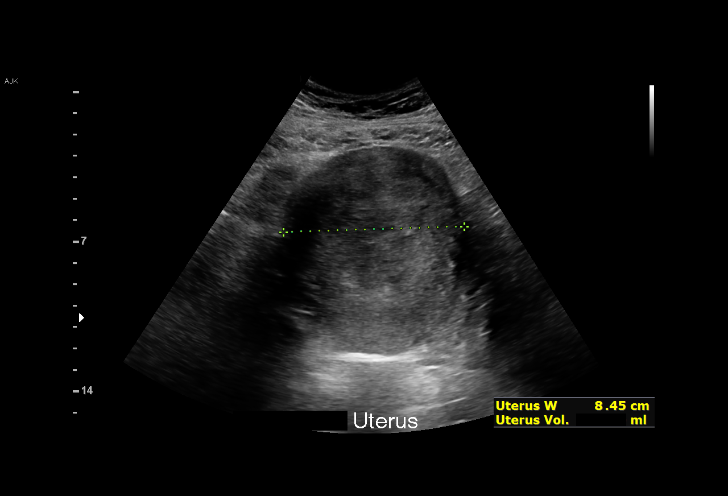
[im 24/38]
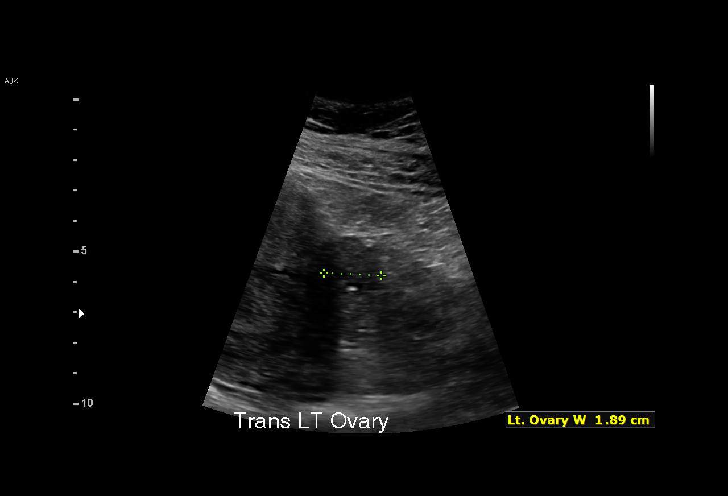
[im 27/38]
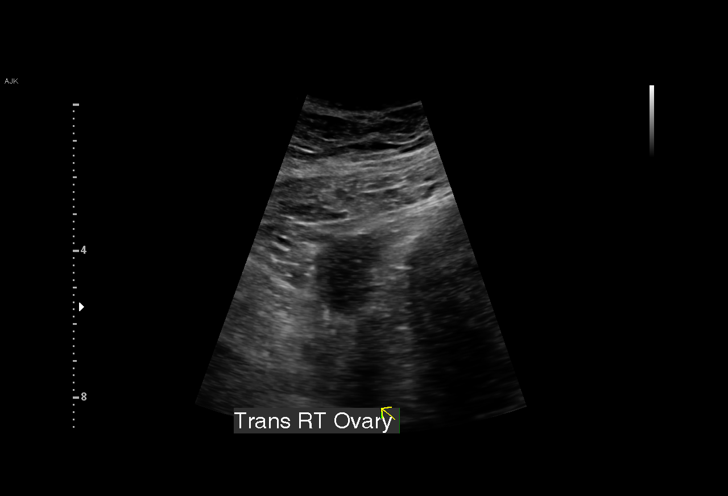
[im 30/38]
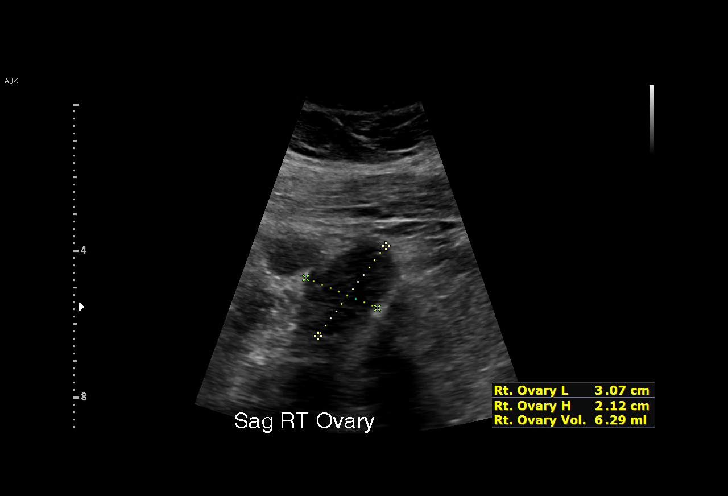
[im 31/38]
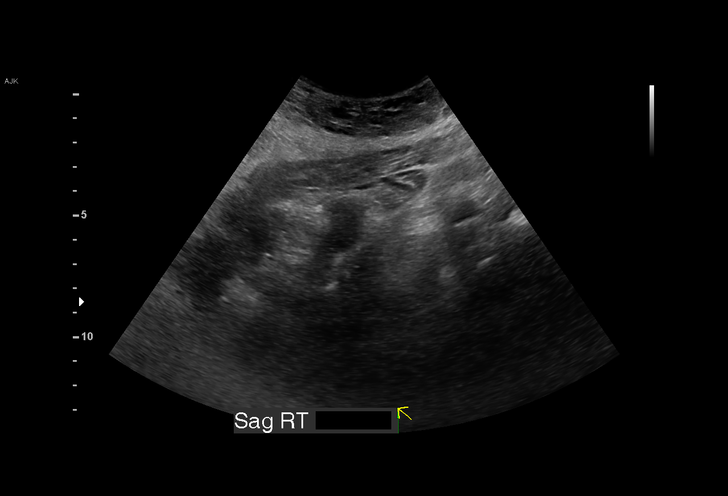
[im 34/38]
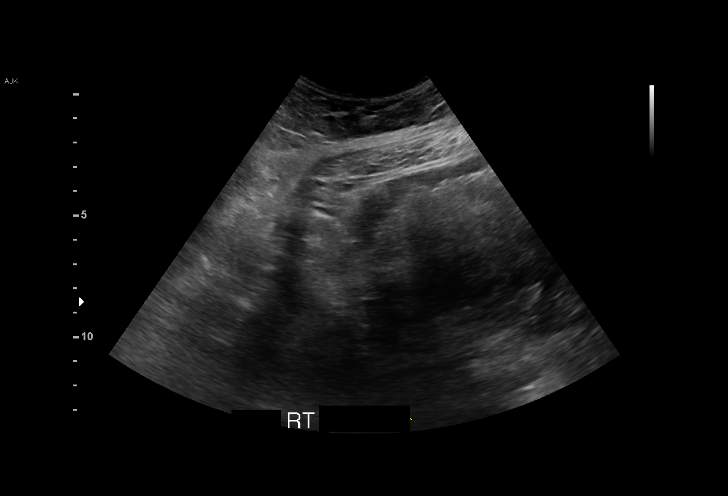
[im 38/38]
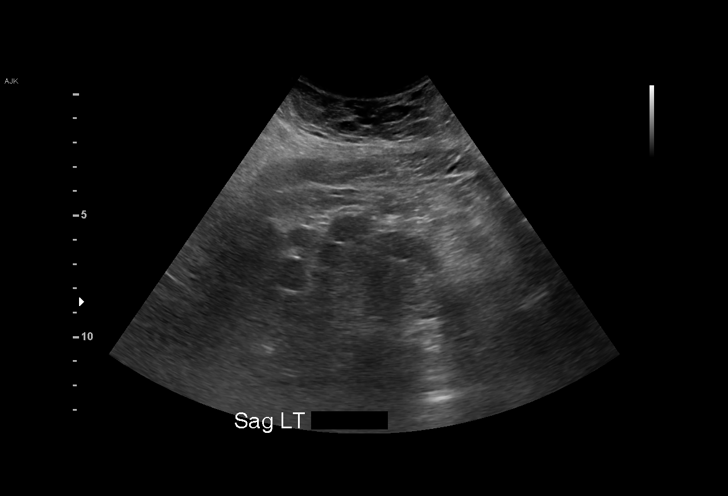

[15 of 25 positions shown; findings below may reference images not displayed]

FINDINGS: Uterus

Measurements: 13.4 x 8.0 x 8.5 cm = volume: 474 mL. No fibroids or
other mass visualized.

Endometrium

Thickness: 31 mm (image 18). Mildly heterogeneous endometrium with
regional mild hypervascularity on Doppler (image 9). But no discrete
or highly vascular mass.

Right ovary

Measurements: 3.1 x 2.1 x 2.9 cm = volume: 6 mL. Normal
appearance/no adnexal mass.

Left ovary

Measurements: 3.5 x 2.1 x 1.9 cm = volume: 7 mL. Normal
appearance/no adnexal mass.

Other findings:  No pelvic free fluid.
IMPRESSION: Heterogeneously thickened endometrium following failed IUP and SAB.
But no discrete retained products of conception.
